# Patient Record
Sex: Female | Born: 1945 | Race: White | Hispanic: No | Marital: Married | Smoking: Never smoker
Health system: Southern US, Community
[De-identification: ages and names within clinical notes are randomized; demographics above are authoritative.]

## PROBLEM LIST (undated history)

## (undated) DIAGNOSIS — E785 Hyperlipidemia, unspecified: Secondary | ICD-10-CM

## (undated) DIAGNOSIS — I2510 Atherosclerotic heart disease of native coronary artery without angina pectoris: Secondary | ICD-10-CM

## (undated) DIAGNOSIS — I6529 Occlusion and stenosis of unspecified carotid artery: Secondary | ICD-10-CM

## (undated) HISTORY — PX: CARDIAC CATHETERIZATION: SHX172

---

## 2015-10-06 DIAGNOSIS — H5203 Hypermetropia, bilateral: Secondary | ICD-10-CM | POA: Diagnosis not present

## 2015-11-18 DIAGNOSIS — H35373 Puckering of macula, bilateral: Secondary | ICD-10-CM | POA: Diagnosis not present

## 2015-11-18 DIAGNOSIS — H43813 Vitreous degeneration, bilateral: Secondary | ICD-10-CM | POA: Diagnosis not present

## 2015-11-23 DIAGNOSIS — H02833 Dermatochalasis of right eye, unspecified eyelid: Secondary | ICD-10-CM | POA: Diagnosis not present

## 2015-11-23 DIAGNOSIS — H25812 Combined forms of age-related cataract, left eye: Secondary | ICD-10-CM | POA: Diagnosis not present

## 2015-11-23 DIAGNOSIS — H25811 Combined forms of age-related cataract, right eye: Secondary | ICD-10-CM | POA: Diagnosis not present

## 2015-11-23 DIAGNOSIS — H35371 Puckering of macula, right eye: Secondary | ICD-10-CM | POA: Diagnosis not present

## 2015-11-24 DIAGNOSIS — H35371 Puckering of macula, right eye: Secondary | ICD-10-CM | POA: Diagnosis not present

## 2015-12-01 DIAGNOSIS — H25811 Combined forms of age-related cataract, right eye: Secondary | ICD-10-CM | POA: Diagnosis not present

## 2015-12-01 DIAGNOSIS — H2511 Age-related nuclear cataract, right eye: Secondary | ICD-10-CM | POA: Diagnosis not present

## 2015-12-15 DIAGNOSIS — H25812 Combined forms of age-related cataract, left eye: Secondary | ICD-10-CM | POA: Diagnosis not present

## 2015-12-15 DIAGNOSIS — H2512 Age-related nuclear cataract, left eye: Secondary | ICD-10-CM | POA: Diagnosis not present

## 2016-02-13 DIAGNOSIS — R7303 Prediabetes: Secondary | ICD-10-CM | POA: Diagnosis not present

## 2016-02-13 DIAGNOSIS — E559 Vitamin D deficiency, unspecified: Secondary | ICD-10-CM | POA: Diagnosis not present

## 2016-02-13 DIAGNOSIS — E785 Hyperlipidemia, unspecified: Secondary | ICD-10-CM | POA: Diagnosis not present

## 2016-02-13 DIAGNOSIS — Z0000 Encounter for general adult medical examination without abnormal findings: Secondary | ICD-10-CM | POA: Diagnosis not present

## 2016-03-28 DIAGNOSIS — H02833 Dermatochalasis of right eye, unspecified eyelid: Secondary | ICD-10-CM | POA: Diagnosis not present

## 2016-03-28 DIAGNOSIS — H02836 Dermatochalasis of left eye, unspecified eyelid: Secondary | ICD-10-CM | POA: Diagnosis not present

## 2016-05-31 DIAGNOSIS — M9903 Segmental and somatic dysfunction of lumbar region: Secondary | ICD-10-CM | POA: Diagnosis not present

## 2016-05-31 DIAGNOSIS — M5414 Radiculopathy, thoracic region: Secondary | ICD-10-CM | POA: Diagnosis not present

## 2016-05-31 DIAGNOSIS — M9902 Segmental and somatic dysfunction of thoracic region: Secondary | ICD-10-CM | POA: Diagnosis not present

## 2016-05-31 DIAGNOSIS — M9905 Segmental and somatic dysfunction of pelvic region: Secondary | ICD-10-CM | POA: Diagnosis not present

## 2016-05-31 DIAGNOSIS — M5416 Radiculopathy, lumbar region: Secondary | ICD-10-CM | POA: Diagnosis not present

## 2016-06-06 DIAGNOSIS — M9905 Segmental and somatic dysfunction of pelvic region: Secondary | ICD-10-CM | POA: Diagnosis not present

## 2016-06-06 DIAGNOSIS — M9902 Segmental and somatic dysfunction of thoracic region: Secondary | ICD-10-CM | POA: Diagnosis not present

## 2016-06-06 DIAGNOSIS — M9903 Segmental and somatic dysfunction of lumbar region: Secondary | ICD-10-CM | POA: Diagnosis not present

## 2016-06-06 DIAGNOSIS — M5414 Radiculopathy, thoracic region: Secondary | ICD-10-CM | POA: Diagnosis not present

## 2016-06-06 DIAGNOSIS — M5416 Radiculopathy, lumbar region: Secondary | ICD-10-CM | POA: Diagnosis not present

## 2016-06-11 DIAGNOSIS — H02836 Dermatochalasis of left eye, unspecified eyelid: Secondary | ICD-10-CM | POA: Diagnosis not present

## 2016-06-11 DIAGNOSIS — H02834 Dermatochalasis of left upper eyelid: Secondary | ICD-10-CM | POA: Diagnosis not present

## 2016-06-11 DIAGNOSIS — H02833 Dermatochalasis of right eye, unspecified eyelid: Secondary | ICD-10-CM | POA: Diagnosis not present

## 2016-06-11 DIAGNOSIS — H02831 Dermatochalasis of right upper eyelid: Secondary | ICD-10-CM | POA: Diagnosis not present

## 2016-08-22 DIAGNOSIS — M5416 Radiculopathy, lumbar region: Secondary | ICD-10-CM | POA: Diagnosis not present

## 2016-08-22 DIAGNOSIS — M9902 Segmental and somatic dysfunction of thoracic region: Secondary | ICD-10-CM | POA: Diagnosis not present

## 2016-08-22 DIAGNOSIS — M9905 Segmental and somatic dysfunction of pelvic region: Secondary | ICD-10-CM | POA: Diagnosis not present

## 2016-08-22 DIAGNOSIS — M9903 Segmental and somatic dysfunction of lumbar region: Secondary | ICD-10-CM | POA: Diagnosis not present

## 2016-08-22 DIAGNOSIS — M5414 Radiculopathy, thoracic region: Secondary | ICD-10-CM | POA: Diagnosis not present

## 2016-08-27 DIAGNOSIS — M9905 Segmental and somatic dysfunction of pelvic region: Secondary | ICD-10-CM | POA: Diagnosis not present

## 2016-08-27 DIAGNOSIS — M5414 Radiculopathy, thoracic region: Secondary | ICD-10-CM | POA: Diagnosis not present

## 2016-08-27 DIAGNOSIS — M5416 Radiculopathy, lumbar region: Secondary | ICD-10-CM | POA: Diagnosis not present

## 2016-08-27 DIAGNOSIS — M9902 Segmental and somatic dysfunction of thoracic region: Secondary | ICD-10-CM | POA: Diagnosis not present

## 2016-08-27 DIAGNOSIS — M9903 Segmental and somatic dysfunction of lumbar region: Secondary | ICD-10-CM | POA: Diagnosis not present

## 2016-09-03 DIAGNOSIS — M5416 Radiculopathy, lumbar region: Secondary | ICD-10-CM | POA: Diagnosis not present

## 2016-09-03 DIAGNOSIS — M9905 Segmental and somatic dysfunction of pelvic region: Secondary | ICD-10-CM | POA: Diagnosis not present

## 2016-09-03 DIAGNOSIS — M9902 Segmental and somatic dysfunction of thoracic region: Secondary | ICD-10-CM | POA: Diagnosis not present

## 2016-09-03 DIAGNOSIS — M5414 Radiculopathy, thoracic region: Secondary | ICD-10-CM | POA: Diagnosis not present

## 2016-09-03 DIAGNOSIS — M9903 Segmental and somatic dysfunction of lumbar region: Secondary | ICD-10-CM | POA: Diagnosis not present

## 2016-10-15 DIAGNOSIS — M9905 Segmental and somatic dysfunction of pelvic region: Secondary | ICD-10-CM | POA: Diagnosis not present

## 2016-10-15 DIAGNOSIS — M5416 Radiculopathy, lumbar region: Secondary | ICD-10-CM | POA: Diagnosis not present

## 2016-10-15 DIAGNOSIS — M9902 Segmental and somatic dysfunction of thoracic region: Secondary | ICD-10-CM | POA: Diagnosis not present

## 2016-10-15 DIAGNOSIS — M9903 Segmental and somatic dysfunction of lumbar region: Secondary | ICD-10-CM | POA: Diagnosis not present

## 2016-10-15 DIAGNOSIS — M5414 Radiculopathy, thoracic region: Secondary | ICD-10-CM | POA: Diagnosis not present

## 2016-12-19 DIAGNOSIS — M5414 Radiculopathy, thoracic region: Secondary | ICD-10-CM | POA: Diagnosis not present

## 2016-12-19 DIAGNOSIS — M5416 Radiculopathy, lumbar region: Secondary | ICD-10-CM | POA: Diagnosis not present

## 2016-12-19 DIAGNOSIS — M9905 Segmental and somatic dysfunction of pelvic region: Secondary | ICD-10-CM | POA: Diagnosis not present

## 2016-12-19 DIAGNOSIS — M9903 Segmental and somatic dysfunction of lumbar region: Secondary | ICD-10-CM | POA: Diagnosis not present

## 2016-12-19 DIAGNOSIS — M9902 Segmental and somatic dysfunction of thoracic region: Secondary | ICD-10-CM | POA: Diagnosis not present

## 2017-01-15 DIAGNOSIS — M9902 Segmental and somatic dysfunction of thoracic region: Secondary | ICD-10-CM | POA: Diagnosis not present

## 2017-01-15 DIAGNOSIS — M5414 Radiculopathy, thoracic region: Secondary | ICD-10-CM | POA: Diagnosis not present

## 2017-01-15 DIAGNOSIS — M5416 Radiculopathy, lumbar region: Secondary | ICD-10-CM | POA: Diagnosis not present

## 2017-01-15 DIAGNOSIS — M9903 Segmental and somatic dysfunction of lumbar region: Secondary | ICD-10-CM | POA: Diagnosis not present

## 2017-01-15 DIAGNOSIS — M9905 Segmental and somatic dysfunction of pelvic region: Secondary | ICD-10-CM | POA: Diagnosis not present

## 2017-02-14 DIAGNOSIS — Z0000 Encounter for general adult medical examination without abnormal findings: Secondary | ICD-10-CM | POA: Diagnosis not present

## 2017-02-14 DIAGNOSIS — Z6838 Body mass index (BMI) 38.0-38.9, adult: Secondary | ICD-10-CM | POA: Diagnosis not present

## 2017-02-14 DIAGNOSIS — E559 Vitamin D deficiency, unspecified: Secondary | ICD-10-CM | POA: Diagnosis not present

## 2017-02-14 DIAGNOSIS — E785 Hyperlipidemia, unspecified: Secondary | ICD-10-CM | POA: Diagnosis not present

## 2017-04-03 DIAGNOSIS — E2839 Other primary ovarian failure: Secondary | ICD-10-CM | POA: Diagnosis not present

## 2017-04-03 DIAGNOSIS — M810 Age-related osteoporosis without current pathological fracture: Secondary | ICD-10-CM | POA: Diagnosis not present

## 2017-05-16 DIAGNOSIS — E559 Vitamin D deficiency, unspecified: Secondary | ICD-10-CM | POA: Diagnosis not present

## 2017-05-16 DIAGNOSIS — E785 Hyperlipidemia, unspecified: Secondary | ICD-10-CM | POA: Diagnosis not present

## 2018-11-19 DIAGNOSIS — E785 Hyperlipidemia, unspecified: Secondary | ICD-10-CM | POA: Diagnosis not present

## 2018-11-19 DIAGNOSIS — Z0000 Encounter for general adult medical examination without abnormal findings: Secondary | ICD-10-CM | POA: Diagnosis not present

## 2018-11-19 DIAGNOSIS — M818 Other osteoporosis without current pathological fracture: Secondary | ICD-10-CM | POA: Diagnosis not present

## 2018-11-19 DIAGNOSIS — E559 Vitamin D deficiency, unspecified: Secondary | ICD-10-CM | POA: Diagnosis not present

## 2019-10-23 ENCOUNTER — Ambulatory Visit: Payer: Self-pay

## 2019-10-28 ENCOUNTER — Ambulatory Visit: Payer: Self-pay

## 2019-10-29 ENCOUNTER — Ambulatory Visit: Payer: PPO | Attending: Internal Medicine

## 2019-12-31 DIAGNOSIS — M818 Other osteoporosis without current pathological fracture: Secondary | ICD-10-CM | POA: Diagnosis not present

## 2019-12-31 DIAGNOSIS — R7301 Impaired fasting glucose: Secondary | ICD-10-CM | POA: Diagnosis not present

## 2019-12-31 DIAGNOSIS — E785 Hyperlipidemia, unspecified: Secondary | ICD-10-CM | POA: Diagnosis not present

## 2019-12-31 DIAGNOSIS — E559 Vitamin D deficiency, unspecified: Secondary | ICD-10-CM | POA: Diagnosis not present

## 2019-12-31 DIAGNOSIS — Z0000 Encounter for general adult medical examination without abnormal findings: Secondary | ICD-10-CM | POA: Diagnosis not present

## 2019-12-31 DIAGNOSIS — M25561 Pain in right knee: Secondary | ICD-10-CM | POA: Diagnosis not present

## 2021-01-18 DIAGNOSIS — R7303 Prediabetes: Secondary | ICD-10-CM | POA: Diagnosis not present

## 2021-01-18 DIAGNOSIS — Z79899 Other long term (current) drug therapy: Secondary | ICD-10-CM | POA: Diagnosis not present

## 2021-01-18 DIAGNOSIS — E559 Vitamin D deficiency, unspecified: Secondary | ICD-10-CM | POA: Diagnosis not present

## 2021-01-30 DIAGNOSIS — M9903 Segmental and somatic dysfunction of lumbar region: Secondary | ICD-10-CM | POA: Diagnosis not present

## 2021-01-30 DIAGNOSIS — M5136 Other intervertebral disc degeneration, lumbar region: Secondary | ICD-10-CM | POA: Diagnosis not present

## 2021-01-30 DIAGNOSIS — M5431 Sciatica, right side: Secondary | ICD-10-CM | POA: Diagnosis not present

## 2021-01-30 DIAGNOSIS — M9905 Segmental and somatic dysfunction of pelvic region: Secondary | ICD-10-CM | POA: Diagnosis not present

## 2021-01-30 DIAGNOSIS — M9902 Segmental and somatic dysfunction of thoracic region: Secondary | ICD-10-CM | POA: Diagnosis not present

## 2021-01-30 DIAGNOSIS — M5134 Other intervertebral disc degeneration, thoracic region: Secondary | ICD-10-CM | POA: Diagnosis not present

## 2021-02-02 DIAGNOSIS — E559 Vitamin D deficiency, unspecified: Secondary | ICD-10-CM | POA: Diagnosis not present

## 2021-02-02 DIAGNOSIS — M818 Other osteoporosis without current pathological fracture: Secondary | ICD-10-CM | POA: Diagnosis not present

## 2021-02-02 DIAGNOSIS — Z1211 Encounter for screening for malignant neoplasm of colon: Secondary | ICD-10-CM | POA: Diagnosis not present

## 2021-02-02 DIAGNOSIS — Z79899 Other long term (current) drug therapy: Secondary | ICD-10-CM | POA: Diagnosis not present

## 2021-02-02 DIAGNOSIS — Z0000 Encounter for general adult medical examination without abnormal findings: Secondary | ICD-10-CM | POA: Diagnosis not present

## 2021-02-02 DIAGNOSIS — R7303 Prediabetes: Secondary | ICD-10-CM | POA: Diagnosis not present

## 2021-02-02 DIAGNOSIS — E785 Hyperlipidemia, unspecified: Secondary | ICD-10-CM | POA: Diagnosis not present

## 2021-02-06 DIAGNOSIS — M9905 Segmental and somatic dysfunction of pelvic region: Secondary | ICD-10-CM | POA: Diagnosis not present

## 2021-02-06 DIAGNOSIS — M5134 Other intervertebral disc degeneration, thoracic region: Secondary | ICD-10-CM | POA: Diagnosis not present

## 2021-02-06 DIAGNOSIS — M9902 Segmental and somatic dysfunction of thoracic region: Secondary | ICD-10-CM | POA: Diagnosis not present

## 2021-02-06 DIAGNOSIS — M5431 Sciatica, right side: Secondary | ICD-10-CM | POA: Diagnosis not present

## 2021-02-06 DIAGNOSIS — M9903 Segmental and somatic dysfunction of lumbar region: Secondary | ICD-10-CM | POA: Diagnosis not present

## 2021-02-06 DIAGNOSIS — M5136 Other intervertebral disc degeneration, lumbar region: Secondary | ICD-10-CM | POA: Diagnosis not present

## 2021-03-08 DIAGNOSIS — M5134 Other intervertebral disc degeneration, thoracic region: Secondary | ICD-10-CM | POA: Diagnosis not present

## 2021-03-08 DIAGNOSIS — M9903 Segmental and somatic dysfunction of lumbar region: Secondary | ICD-10-CM | POA: Diagnosis not present

## 2021-03-08 DIAGNOSIS — M5136 Other intervertebral disc degeneration, lumbar region: Secondary | ICD-10-CM | POA: Diagnosis not present

## 2021-03-08 DIAGNOSIS — M5431 Sciatica, right side: Secondary | ICD-10-CM | POA: Diagnosis not present

## 2021-03-08 DIAGNOSIS — M9905 Segmental and somatic dysfunction of pelvic region: Secondary | ICD-10-CM | POA: Diagnosis not present

## 2021-03-08 DIAGNOSIS — M9902 Segmental and somatic dysfunction of thoracic region: Secondary | ICD-10-CM | POA: Diagnosis not present

## 2021-04-17 DIAGNOSIS — M9905 Segmental and somatic dysfunction of pelvic region: Secondary | ICD-10-CM | POA: Diagnosis not present

## 2021-04-17 DIAGNOSIS — M5136 Other intervertebral disc degeneration, lumbar region: Secondary | ICD-10-CM | POA: Diagnosis not present

## 2021-04-17 DIAGNOSIS — M5431 Sciatica, right side: Secondary | ICD-10-CM | POA: Diagnosis not present

## 2021-04-17 DIAGNOSIS — M5134 Other intervertebral disc degeneration, thoracic region: Secondary | ICD-10-CM | POA: Diagnosis not present

## 2021-04-17 DIAGNOSIS — M9902 Segmental and somatic dysfunction of thoracic region: Secondary | ICD-10-CM | POA: Diagnosis not present

## 2021-04-17 DIAGNOSIS — M9903 Segmental and somatic dysfunction of lumbar region: Secondary | ICD-10-CM | POA: Diagnosis not present

## 2021-06-19 ENCOUNTER — Emergency Department (HOSPITAL_BASED_OUTPATIENT_CLINIC_OR_DEPARTMENT_OTHER)
Admission: EM | Admit: 2021-06-19 | Discharge: 2021-06-19 | Disposition: A | Discharge status: Home / Self Care | Payer: PPO | Attending: Emergency Medicine | Admitting: Emergency Medicine

## 2021-06-19 ENCOUNTER — Other Ambulatory Visit: Payer: Self-pay

## 2021-06-19 ENCOUNTER — Emergency Department (HOSPITAL_BASED_OUTPATIENT_CLINIC_OR_DEPARTMENT_OTHER): Payer: PPO | Admitting: Radiology

## 2021-06-19 ENCOUNTER — Encounter (HOSPITAL_BASED_OUTPATIENT_CLINIC_OR_DEPARTMENT_OTHER): Payer: Self-pay | Admitting: Emergency Medicine

## 2021-06-19 DIAGNOSIS — W01198A Fall on same level from slipping, tripping and stumbling with subsequent striking against other object, initial encounter: Secondary | ICD-10-CM | POA: Diagnosis not present

## 2021-06-19 DIAGNOSIS — Y9301 Activity, walking, marching and hiking: Secondary | ICD-10-CM | POA: Diagnosis not present

## 2021-06-19 DIAGNOSIS — M545 Low back pain, unspecified: Secondary | ICD-10-CM | POA: Diagnosis not present

## 2021-06-19 DIAGNOSIS — M5459 Other low back pain: Secondary | ICD-10-CM | POA: Diagnosis not present

## 2021-06-19 MED ORDER — ACETAMINOPHEN 500 MG PO TABS
1000.0000 mg | ORAL_TABLET | Freq: Once | ORAL | Status: AC
  Administered 2021-06-19: 1000 mg via ORAL
  Filled 2021-06-19: qty 2

## 2021-06-19 NOTE — ED Triage Notes (Signed)
Pt arrives to ED with c/o back/hip pain after falling into a pool on Wednesday 9/21. Pt reports she slipped while walking into a pool and slid down on the cement steps.

## 2021-06-19 NOTE — Discharge Instructions (Addendum)
Your x-ray did not show any signs of a fracture.  Continue taking Tylenol 650 mg 3 times a day as needed for pain.  Follow-up with your doctor within the week.  Return to the ER immediately if you have new numbness weakness worsening pain or any additional concerns.

## 2021-06-19 NOTE — ED Provider Notes (Signed)
[facility] EMERGENCY DEPT Provider Note   CSN: [id number] Arrival date & time: 06/19/21  0946     History Chief Complaint  Patient presents with   Back Pain    Cathy Love is a 75 y.o. female.  Patient presents to ER chief complaint of right lower back pain.  She states symptoms been ongoing for the past 5 days.  She states that 5 days ago she slipped while in walking on a pool and fell down cement steps on her back.  Complaining of sharp pain in the right lower back since then.  Symptoms have not improved so she presents to ER.  Denies any new numbness or weakness.  Has been taking Advil around-the-clock for her pain at home.  No fever no cough no vomiting no diarrhea.  No bowel or bladder dysfunction.  No new numbness.      History reviewed. No pertinent past medical history.  There are no problems to display for this patient.   History reviewed. No pertinent surgical history.   OB History   No obstetric history on file.     History reviewed. No pertinent family history.  Social History   Tobacco Use   Smoking status: Never   Smokeless tobacco: Never    Home Medications Prior to Admission medications   Not on File    Allergies    Patient has no known allergies.  Review of Systems   Review of Systems  Constitutional:  Negative for fever.  HENT:  Negative for ear pain.   Eyes:  Negative for pain.  Respiratory:  Negative for cough.   Cardiovascular:  Negative for chest pain.  Gastrointestinal:  Negative for abdominal pain.  Genitourinary:  Negative for flank pain.  Musculoskeletal:  Positive for back pain.  Skin:  Negative for rash.  Neurological:  Negative for headaches.   Physical Exam Updated Vital Signs BP (!) 140/95 (BP Location: Left Arm)   Pulse 73   Temp 98.1 F (36.7 C)   Resp 18   Ht 4\' 9"  (1.448 m)   Wt 88.5 kg   SpO2 97%   BMI 42.20 kg/m   Physical Exam Constitutional:      General: She is not in acute  distress.    Appearance: Normal appearance.  HENT:     Head: Normocephalic.     Nose: Nose normal.  Eyes:     Extraocular Movements: Extraocular movements intact.  Cardiovascular:     Rate and Rhythm: Normal rate.  Pulmonary:     Effort: Pulmonary effort is normal.  Musculoskeletal:        General: Normal range of motion.     Cervical back: Normal range of motion.     Comments: No C or T or L-spine midline step-offs or tenderness noted.  Mild L4-5 right paraspinal tenderness present.  Neurological:     General: No focal deficit present.     Mental Status: She is alert. Mental status is at baseline.     Comments: No saddle anesthesia noted.    ED Results / Procedures / Treatments   Labs (all labs ordered are listed, but only abnormal results are displayed) Labs Reviewed - No data to display  EKG None  Radiology DG Lumbar Spine Complete  Result Date: 06/19/2021 CLINICAL DATA:  Back pain with history of injury EXAM: LUMBAR SPINE - COMPLETE 4+ VIEW COMPARISON:  None. FINDINGS: No evidence of lumbar spine fracture. Levoscoliosis with rotatory component. Multilevel moderate degenerative disc disease. Mild facet  arthropathy. Aortic atherosclerotic calcifications. Soft tissues are unremarkable. IMPRESSION: No evidence of lumbar spine fracture, although presence of significant levocurvature limits evaluation of vertebral body heights on lateral view. Moderate multilevel degenerative disc disease. Electronically Signed   By: Allegra Lai M.D.   On: 06/19/2021 11:50    Procedures Procedures   Medications Ordered in ED Medications  acetaminophen (TYLENOL) tablet 1,000 mg (1,000 mg Oral Given 06/19/21 1110)    ED Course  I have reviewed the triage vital signs and the nursing notes.  Pertinent labs & imaging results that were available during my care of the patient were reviewed by me and considered in my medical decision making (see chart for details).    MDM  Rules/Calculators/A&P                           Patient given Tylenol here, declining narcotic medication.  X-ray shows no acute fracture.  Patient advised continue Tylenol and Motrin use at home, advising immediate return for new numbness weakness worsening pain or additional concerns.  Advised her to follow-up with her doctor within the week.  Final Clinical Impression(s) / ED Diagnoses Final diagnoses:  Acute right-sided low back pain without sciatica    Rx / DC Orders ED Discharge Orders     None        Cheryll Cockayne, MD 06/19/21 1156

## 2021-07-25 ENCOUNTER — Ambulatory Visit: Payer: PPO

## 2021-08-07 ENCOUNTER — Other Ambulatory Visit (HOSPITAL_BASED_OUTPATIENT_CLINIC_OR_DEPARTMENT_OTHER): Payer: Self-pay

## 2021-08-07 ENCOUNTER — Ambulatory Visit: Payer: PPO | Attending: Internal Medicine

## 2021-08-07 DIAGNOSIS — Z23 Encounter for immunization: Secondary | ICD-10-CM

## 2021-08-07 MED ORDER — MODERNA COVID-19 BIVAL BOOSTER 50 MCG/0.5ML IM SUSP
INTRAMUSCULAR | 0 refills | Status: DC
  Filled 2021-08-07: qty 0.5, 1d supply, fill #0

## 2021-08-07 NOTE — Progress Notes (Signed)
   Covid-19 Vaccination Clinic  Name:  Cathy Love    MRN: [id number] DOB: [birth year]  08/07/2021  [name] was observed post Covid-19 immunization for 15 minutes without incident. She was provided with Vaccine Information Sheet and instruction to access the V-Safe system.   [name] was instructed to call 911 with any severe reactions post vaccine: Difficulty breathing  Swelling of face and throat  A fast heartbeat  A bad rash all over body  Dizziness and weakness   Immunizations Administered     Name Date Dose VIS Date Route   Moderna Covid-19 vaccine Bivalent Booster 08/07/2021 11:42 AM 0.5 mL 05/06/2021 Intramuscular   Manufacturer: Moderna   Lot: [id number]   NDC: 62694-854-62

## 2021-08-28 ENCOUNTER — Other Ambulatory Visit (HOSPITAL_BASED_OUTPATIENT_CLINIC_OR_DEPARTMENT_OTHER): Payer: Self-pay

## 2021-08-28 MED ORDER — FLUAD QUADRIVALENT 0.5 ML IM PRSY
PREFILLED_SYRINGE | INTRAMUSCULAR | 0 refills | Status: DC
  Filled 2021-08-28: qty 0.5, 1d supply, fill #0

## 2021-09-04 ENCOUNTER — Other Ambulatory Visit (HOSPITAL_BASED_OUTPATIENT_CLINIC_OR_DEPARTMENT_OTHER): Payer: Self-pay

## 2022-02-05 DIAGNOSIS — E785 Hyperlipidemia, unspecified: Secondary | ICD-10-CM | POA: Diagnosis not present

## 2022-02-05 DIAGNOSIS — R7303 Prediabetes: Secondary | ICD-10-CM | POA: Diagnosis not present

## 2022-02-05 DIAGNOSIS — M818 Other osteoporosis without current pathological fracture: Secondary | ICD-10-CM | POA: Diagnosis not present

## 2022-02-05 DIAGNOSIS — E559 Vitamin D deficiency, unspecified: Secondary | ICD-10-CM | POA: Diagnosis not present

## 2022-02-12 DIAGNOSIS — R7303 Prediabetes: Secondary | ICD-10-CM | POA: Diagnosis not present

## 2022-02-12 DIAGNOSIS — E785 Hyperlipidemia, unspecified: Secondary | ICD-10-CM | POA: Diagnosis not present

## 2022-02-12 DIAGNOSIS — G3184 Mild cognitive impairment, so stated: Secondary | ICD-10-CM | POA: Diagnosis not present

## 2022-02-12 DIAGNOSIS — Z0000 Encounter for general adult medical examination without abnormal findings: Secondary | ICD-10-CM | POA: Diagnosis not present

## 2022-02-12 DIAGNOSIS — E559 Vitamin D deficiency, unspecified: Secondary | ICD-10-CM | POA: Diagnosis not present

## 2022-02-12 DIAGNOSIS — M818 Other osteoporosis without current pathological fracture: Secondary | ICD-10-CM | POA: Diagnosis not present

## 2022-02-18 ENCOUNTER — Inpatient Hospital Stay (HOSPITAL_COMMUNITY)
Admission: EM | Admit: 2022-02-18 | Discharge: 2022-02-26 | DRG: 234 | Disposition: A | Discharge status: Home Health | Payer: PPO | Attending: Cardiothoracic Surgery | Admitting: Cardiothoracic Surgery

## 2022-02-18 ENCOUNTER — Inpatient Hospital Stay (HOSPITAL_COMMUNITY): Payer: PPO

## 2022-02-18 ENCOUNTER — Encounter (HOSPITAL_COMMUNITY): Payer: Self-pay

## 2022-02-18 ENCOUNTER — Emergency Department (HOSPITAL_COMMUNITY): Payer: PPO

## 2022-02-18 ENCOUNTER — Other Ambulatory Visit: Payer: Self-pay

## 2022-02-18 ENCOUNTER — Other Ambulatory Visit (HOSPITAL_COMMUNITY): Payer: PPO

## 2022-02-18 DIAGNOSIS — Z951 Presence of aortocoronary bypass graft: Secondary | ICD-10-CM

## 2022-02-18 DIAGNOSIS — Z7982 Long term (current) use of aspirin: Secondary | ICD-10-CM | POA: Diagnosis not present

## 2022-02-18 DIAGNOSIS — R202 Paresthesia of skin: Secondary | ICD-10-CM | POA: Diagnosis not present

## 2022-02-18 DIAGNOSIS — I214 Non-ST elevation (NSTEMI) myocardial infarction: Principal | ICD-10-CM | POA: Diagnosis present

## 2022-02-18 DIAGNOSIS — E785 Hyperlipidemia, unspecified: Secondary | ICD-10-CM | POA: Diagnosis not present

## 2022-02-18 DIAGNOSIS — E669 Obesity, unspecified: Secondary | ICD-10-CM | POA: Diagnosis not present

## 2022-02-18 DIAGNOSIS — R079 Chest pain, unspecified: Secondary | ICD-10-CM | POA: Diagnosis not present

## 2022-02-18 DIAGNOSIS — Z9049 Acquired absence of other specified parts of digestive tract: Secondary | ICD-10-CM

## 2022-02-18 DIAGNOSIS — R778 Other specified abnormalities of plasma proteins: Secondary | ICD-10-CM | POA: Diagnosis not present

## 2022-02-18 DIAGNOSIS — Z4682 Encounter for fitting and adjustment of non-vascular catheter: Secondary | ICD-10-CM | POA: Diagnosis not present

## 2022-02-18 DIAGNOSIS — I200 Unstable angina: Secondary | ICD-10-CM | POA: Diagnosis present

## 2022-02-18 DIAGNOSIS — K219 Gastro-esophageal reflux disease without esophagitis: Secondary | ICD-10-CM | POA: Diagnosis present

## 2022-02-18 DIAGNOSIS — Z6841 Body Mass Index (BMI) 40.0 and over, adult: Secondary | ICD-10-CM

## 2022-02-18 DIAGNOSIS — D696 Thrombocytopenia, unspecified: Secondary | ICD-10-CM | POA: Diagnosis not present

## 2022-02-18 DIAGNOSIS — D62 Acute posthemorrhagic anemia: Secondary | ICD-10-CM | POA: Diagnosis not present

## 2022-02-18 DIAGNOSIS — D689 Coagulation defect, unspecified: Secondary | ICD-10-CM | POA: Diagnosis present

## 2022-02-18 DIAGNOSIS — Z87891 Personal history of nicotine dependence: Secondary | ICD-10-CM

## 2022-02-18 DIAGNOSIS — E162 Hypoglycemia, unspecified: Secondary | ICD-10-CM | POA: Diagnosis not present

## 2022-02-18 DIAGNOSIS — R531 Weakness: Secondary | ICD-10-CM | POA: Diagnosis not present

## 2022-02-18 DIAGNOSIS — I25119 Atherosclerotic heart disease of native coronary artery with unspecified angina pectoris: Secondary | ICD-10-CM | POA: Diagnosis not present

## 2022-02-18 DIAGNOSIS — E8770 Fluid overload, unspecified: Secondary | ICD-10-CM | POA: Diagnosis not present

## 2022-02-18 DIAGNOSIS — I2510 Atherosclerotic heart disease of native coronary artery without angina pectoris: Secondary | ICD-10-CM | POA: Diagnosis not present

## 2022-02-18 DIAGNOSIS — G4489 Other headache syndrome: Secondary | ICD-10-CM | POA: Diagnosis not present

## 2022-02-18 DIAGNOSIS — I491 Atrial premature depolarization: Secondary | ICD-10-CM | POA: Diagnosis not present

## 2022-02-18 DIAGNOSIS — R0789 Other chest pain: Principal | ICD-10-CM

## 2022-02-18 DIAGNOSIS — I088 Other rheumatic multiple valve diseases: Secondary | ICD-10-CM | POA: Diagnosis not present

## 2022-02-18 DIAGNOSIS — I8393 Asymptomatic varicose veins of bilateral lower extremities: Secondary | ICD-10-CM | POA: Diagnosis not present

## 2022-02-18 DIAGNOSIS — I25110 Atherosclerotic heart disease of native coronary artery with unstable angina pectoris: Secondary | ICD-10-CM | POA: Diagnosis not present

## 2022-02-18 DIAGNOSIS — R7989 Other specified abnormal findings of blood chemistry: Secondary | ICD-10-CM

## 2022-02-18 DIAGNOSIS — I3139 Other pericardial effusion (noninflammatory): Secondary | ICD-10-CM | POA: Diagnosis not present

## 2022-02-18 DIAGNOSIS — R918 Other nonspecific abnormal finding of lung field: Secondary | ICD-10-CM | POA: Diagnosis not present

## 2022-02-18 DIAGNOSIS — J9811 Atelectasis: Secondary | ICD-10-CM | POA: Diagnosis not present

## 2022-02-18 DIAGNOSIS — J90 Pleural effusion, not elsewhere classified: Secondary | ICD-10-CM | POA: Diagnosis not present

## 2022-02-18 DIAGNOSIS — I252 Old myocardial infarction: Secondary | ICD-10-CM | POA: Diagnosis not present

## 2022-02-18 LAB — ECHOCARDIOGRAM COMPLETE
AR max vel: 1.81 cm2
AV Area VTI: 1.73 cm2
AV Area mean vel: 1.71 cm2
AV Mean grad: 4 mmHg
AV Peak grad: 7.8 mmHg
Ao pk vel: 1.4 m/s
Area-P 1/2: 2.99 cm2
Calc EF: 45.6 %
Height: 56.5 in
S' Lateral: 1.9 cm
Single Plane A2C EF: 45 %
Single Plane A4C EF: 45.6 %
Weight: 2938.29 oz

## 2022-02-18 LAB — HEPATIC FUNCTION PANEL
ALT: 13 U/L (ref 0–44)
AST: 19 U/L (ref 15–41)
Albumin: 3.2 g/dL — ABNORMAL LOW (ref 3.5–5.0)
Alkaline Phosphatase: 34 U/L — ABNORMAL LOW (ref 38–126)
Bilirubin, Direct: <0.1 mg/dL (ref 0.0–0.2)
Total Bilirubin: 0.5 mg/dL (ref 0.3–1.2)
Total Protein: 6 g/dL — ABNORMAL LOW (ref 6.5–8.1)

## 2022-02-18 LAB — MAGNESIUM: Magnesium: 2.1 mg/dL (ref 1.7–2.4)

## 2022-02-18 LAB — CBC WITH DIFFERENTIAL/PLATELET
Abs Immature Granulocytes: 0.02 10*3/uL (ref 0.00–0.07)
Basophils Absolute: 0.1 10*3/uL (ref 0.0–0.1)
Basophils Relative: 1 %
Eosinophils Absolute: 0.1 10*3/uL (ref 0.0–0.5)
Eosinophils Relative: 3 %
HCT: 44.3 % (ref 36.0–46.0)
Hemoglobin: 14.3 g/dL (ref 12.0–15.0)
Immature Granulocytes: 0 %
Lymphocytes Relative: 26 %
Lymphs Abs: 1.4 10*3/uL (ref 0.7–4.0)
MCH: 29.9 pg (ref 26.0–34.0)
MCHC: 32.3 g/dL (ref 30.0–36.0)
MCV: 92.5 fL (ref 80.0–100.0)
Monocytes Absolute: 0.5 10*3/uL (ref 0.1–1.0)
Monocytes Relative: 9 %
Neutro Abs: 3.2 10*3/uL (ref 1.7–7.7)
Neutrophils Relative %: 61 %
Platelets: 211 10*3/uL (ref 150–400)
RBC: 4.79 MIL/uL (ref 3.87–5.11)
RDW: 13.4 % (ref 11.5–15.5)
WBC: 5.2 10*3/uL (ref 4.0–10.5)
nRBC: 0 % (ref 0.0–0.2)

## 2022-02-18 LAB — TROPONIN I (HIGH SENSITIVITY)
Troponin I (High Sensitivity): 36 ng/L — ABNORMAL HIGH (ref <18)
Troponin I (High Sensitivity): 182 ng/L (ref <18)
Troponin I (High Sensitivity): 584 ng/L (ref <18)

## 2022-02-18 LAB — BASIC METABOLIC PANEL
Anion gap: 9 (ref 5–15)
BUN: 18 mg/dL (ref 8–23)
CO2: 23 mmol/L (ref 22–32)
Calcium: 8.8 mg/dL — ABNORMAL LOW (ref 8.9–10.3)
Chloride: 108 mmol/L (ref 98–111)
Creatinine, Ser: 0.74 mg/dL (ref 0.44–1.00)
GFR, Estimated: >60 mL/min (ref >60)
Glucose, Bld: 113 mg/dL — ABNORMAL HIGH (ref 70–99)
Potassium: 4.3 mmol/L (ref 3.5–5.1)
Sodium: 140 mmol/L (ref 135–145)

## 2022-02-18 LAB — HEPARIN LEVEL (UNFRACTIONATED): Heparin Unfractionated: 0.36 IU/mL (ref 0.30–0.70)

## 2022-02-18 LAB — HEMOGLOBIN A1C
Hgb A1c MFr Bld: 5.6 % (ref 4.8–5.6)
Mean Plasma Glucose: 114.02 mg/dL

## 2022-02-18 LAB — TSH: TSH: 0.888 u[IU]/mL (ref 0.350–4.500)

## 2022-02-18 MED ORDER — ONDANSETRON HCL 4 MG/2ML IJ SOLN
4.0000 mg | Freq: Four times a day (QID) | INTRAMUSCULAR | Status: DC | PRN
Start: 2022-02-18 — End: 2022-02-20

## 2022-02-18 MED ORDER — SODIUM CHLORIDE 0.9% FLUSH
3.0000 mL | Freq: Two times a day (BID) | INTRAVENOUS | Status: DC
  Administered 2022-02-19 – 2022-02-20 (×2): 3 mL via INTRAVENOUS

## 2022-02-18 MED ORDER — ASPIRIN 81 MG PO TBEC: 324.0000 mg | DELAYED_RELEASE_TABLET | Freq: Once | ORAL | Status: DC

## 2022-02-18 MED ORDER — ASPIRIN 81 MG PO CHEW: 324.0000 mg | CHEWABLE_TABLET | ORAL | Status: AC

## 2022-02-18 MED ORDER — SODIUM CHLORIDE 0.9 % IV SOLN: INTRAVENOUS | Status: DC

## 2022-02-18 MED ORDER — HEPARIN (PORCINE) 25000 UT/250ML-% IV SOLN
1000.0000 [IU]/h | INTRAVENOUS | Status: DC
  Administered 2022-02-18 – 2022-02-19 (×2): 1000 [IU]/h via INTRAVENOUS
  Filled 2022-02-18 (×2): qty 250

## 2022-02-18 MED ORDER — ACETAMINOPHEN 325 MG PO TABS: 650.0000 mg | ORAL_TABLET | ORAL | Status: DC | PRN

## 2022-02-18 MED ORDER — METOPROLOL TARTRATE 12.5 MG HALF TABLET
12.5000 mg | ORAL_TABLET | Freq: Two times a day (BID) | ORAL | Status: DC
Start: 2022-02-18 — End: 2022-02-21
  Administered 2022-02-18 – 2022-02-20 (×6): 12.5 mg via ORAL
  Filled 2022-02-18 (×6): qty 1

## 2022-02-18 MED ORDER — NITROGLYCERIN 0.4 MG SL SUBL: 0.4000 mg | SUBLINGUAL_TABLET | SUBLINGUAL | Status: DC | PRN

## 2022-02-18 MED ORDER — ATORVASTATIN CALCIUM 80 MG PO TABS
80.0000 mg | ORAL_TABLET | Freq: Every day | ORAL | Status: DC
  Administered 2022-02-18 – 2022-02-19 (×2): 80 mg via ORAL
  Filled 2022-02-18 (×2): qty 1

## 2022-02-18 MED ORDER — ASPIRIN 300 MG RE SUPP
300.0000 mg | RECTAL | Status: AC
  Filled 2022-02-18: qty 1

## 2022-02-18 MED ORDER — HEPARIN BOLUS VIA INFUSION
4000.0000 [IU] | Freq: Once | INTRAVENOUS | Status: AC
  Administered 2022-02-18: 4000 [IU] via INTRAVENOUS
  Filled 2022-02-18: qty 4000

## 2022-02-18 MED ORDER — ASPIRIN 81 MG PO TBEC
81.0000 mg | DELAYED_RELEASE_TABLET | Freq: Every day | ORAL | Status: DC
  Administered 2022-02-19: 81 mg via ORAL
  Filled 2022-02-18: qty 1

## 2022-02-18 NOTE — ED Triage Notes (Signed)
Pt took 324 of ASA PTA.

## 2022-02-18 NOTE — ED Provider Notes (Signed)
[facility] EMERGENCY DEPARTMENT Provider Note   CSN: [id number] Arrival date & time: 02/18/22  0436     History  Chief Complaint  Patient presents with   Chest Pain    Cathy Love is a 76 y.o. female.  The history is provided by the patient.  Chest Pain She has no significant past history and comes in because of an episode of chest tightness.  She states that at about 8 PM, she started having a mild headache.  About midnight, she started having pain in her jaw.  Jaw pain was in both sides.  At 2:30 AM, she started having a tight feeling in her chest.  This did not radiate.  There is no associated dyspnea, nausea, diaphoresis.  She did call EMS and was told to take aspirin and she took aspirin 324 mg.  She states that around the time the ambulance arrived, all symptoms have resolved and she feels like she is back to her baseline.  She estimates that symptoms resolved at about 4:30 AM.  She has never had symptoms like this before.  She denies history of diabetes, hypertension, hyperlipidemia and she is a non-smoker.   Home Medications Prior to Admission medications   Medication Sig Start Date End Date Taking? Authorizing Provider  COVID-19 mRNA bivalent vaccine, Moderna, (MODERNA COVID-19 BIVAL BOOSTER) 50 MCG/0.5ML injection Inject into the muscle. 08/07/21   Judyann Munson, MD  influenza vaccine adjuvanted (FLUAD QUADRIVALENT) 0.5 ML injection Inject into the muscle. 08/28/21         Allergies    Patient has no known allergies.    Review of Systems   Review of Systems  Cardiovascular:  Positive for chest pain.  All other systems reviewed and are negative.  Physical Exam Updated Vital Signs BP (!) 147/87 (BP Location: Right Arm)   Pulse 65   Temp 97.8 F (36.6 C) (Oral)   Resp 15   SpO2 97%  Physical Exam Vitals and nursing note reviewed.  76 year old female, resting comfortably and in no acute distress. Vital signs are normal. Oxygen saturation is  95%, which is normal. Head is normocephalic and atraumatic. PERRLA, EOMI. Oropharynx is clear. Neck is nontender and supple without adenopathy or JVD. Back is nontender and there is no CVA tenderness. Lungs are clear without rales, wheezes, or rhonchi. Chest is nontender. Heart has regular rate and rhythm without murmur. Abdomen is soft, flat, nontender. Extremities have no cyanosis or edema, full range of motion is present. Skin is warm and dry without rash. Neurologic: Mental status is normal, cranial nerves are intact, moves all extremities equally.  ED Results / Procedures / Treatments   Labs (all labs ordered are listed, but only abnormal results are displayed) Labs Reviewed - No data to display  EKG EKG Interpretation  Date/Time:  Sunday Feb 18 2022 04:42:45 EDT Ventricular Rate:  61 PR Interval:  151 QRS Duration: 90 QT Interval:  432 QTC Calculation: 436 R Axis:   -5 Text Interpretation: Sinus rhythm Normal ECG No old tracing to compare Confirmed by Dione Booze ([id number]) on 02/18/2022 5:05:21 AM  Radiology No results found.  Procedures Procedures  Cardiac monitor shows normal sinus rhythm, per my interpretation.  Medications Ordered in ED Medications - No data to display  ED Course/ Medical Decision Making/ A&P                           Medical Decision Making Amount  and/or Complexity of Data Reviewed Labs: ordered. Radiology: ordered.   Episode of chest discomfort, jaw pain, headache.  Certainly need to consider angina equivalent.  Symptoms are atypical for pulmonary embolism, pneumonia.  Doubt pneumothorax, pericarditis, thoracic aneurysm.  Old records are reviewed, and she has no relevant past visits.  ECG is obtained and I have independently viewed and interpreted it and it is completely normal with no prior ECGs available for comparison.  Screening labs are ordered including troponin x2, also chest x-ray is ordered to rule out pneumonia.  I have reviewed  and interpreted patient's laboratory tests.  Initial troponin is mildly elevated at 36.  Patient continues to be pain-free.  With minimal troponin elevation and patient symptom-free, I do not feel she needs to be heparinized at this point.  Even with elevated troponin, patient's heart score is 3, which still puts her at low risk for major adverse cardiac events in the next 6 weeks.  She is being kept in the emergency department for repeat troponin.  Case is signed out to Dr. Sabra Heck.  Final Clinical Impression(s) / ED Diagnoses Final diagnoses:  Chest discomfort  Elevated troponin    Rx / DC Orders ED Discharge Orders     None         Delora Fuel, MD [id number] [phone]

## 2022-02-18 NOTE — Progress Notes (Signed)
ANTICOAGULATION CONSULT NOTE - Initial Consult  Pharmacy Consult for heparin Indication: chest pain/ACS  No Known Allergies  Patient Measurements:   Heparin Dosing Weight: 81kg  Vital Signs: Temp: 97.8 F (36.6 C) (05/28 0442) Temp Source: Oral (05/28 0442) BP: 121/74 (05/28 0900) Pulse Rate: 67 (05/28 0900)  Labs: Recent Labs    02/18/22 0450 02/18/22 0713  HGB 14.3  --   HCT 44.3  --   PLT 211  --   CREATININE 0.74  --   TROPONINIHS 36* 182*    CrCl cannot be calculated (Unknown ideal weight.).   Medical History: History reviewed. No pertinent past medical history.   Assessment: 67 YOF presenting with CP, elevated troponin, she is not on anticoagulation PTA, CBC wnl  Goal of Therapy:  Heparin level 0.3-0.7 units/ml Monitor platelets by anticoagulation protocol: Yes   Plan:  Heparin 4000 units IV x 1, and gtt at 1000 units/hr F/u 8 hour heparin level F/u cards eval and recs  Daylene Posey, PharmD Clinical Pharmacist ED Pharmacist Phone # [phone] 02/18/2022 9:52 AM

## 2022-02-18 NOTE — ED Provider Notes (Signed)
Pt care signed out at change of shift -   The patient is not having any chest pain at this time but did have significant chest discomfort last night associated with some radiation to the bilateral jaws.  She has no prior history of cardiac disease  She does not have a prior cardiac history.  Initial troponin in the 30s repeated in 180s, EKG will be repeated cardiology will be consulted, heparin will be started  This patient is critically ill with a non-ST elevation myocardial infarction.   EKG Interpretation  Date/Time:  Sunday Feb 18 2022 04:42:45 EDT Ventricular Rate:  61 PR Interval:  151 QRS Duration: 90 QT Interval:  432 QTC Calculation: 436 R Axis:   -5 Text Interpretation: Sinus rhythm Normal ECG No old tracing to compare Confirmed by Dione Booze ([id number]) on 02/18/2022 5:05:21 AM        EKG Interpretation  Date/Time:  Sunday Feb 18 2022 09:35:09 EDT Ventricular Rate:  69 PR Interval:  146 QRS Duration: 85 QT Interval:  413 QTC Calculation: 443 R Axis:   3 Text Interpretation: Sinus rhythm Normal ECG Confirmed by Eber Hong [phone]) on 02/18/2022 9:37:26 AM         .Critical Care Performed by: Eber Hong, MD Authorized by: Eber Hong, MD   Critical care provider statement:    Critical care time (minutes):  30   Critical care time was exclusive of:  Separately billable procedures and treating other patients and teaching time   Critical care was necessary to treat or prevent imminent or life-threatening deterioration of the following conditions:  Cardiac failure   Critical care was time spent personally by me on the following activities:  Development of treatment plan with patient or surrogate, discussions with consultants, evaluation of patient's response to treatment, examination of patient, ordering and review of laboratory studies, ordering and review of radiographic studies, ordering and performing treatments and interventions, pulse oximetry,  re-evaluation of patient's condition, review of old charts and obtaining history from patient or surrogate   I assumed direction of critical care for this patient from another provider in my specialty: yes     Care discussed with: admitting provider   Comments:       Final diagnoses:  Chest discomfort  Elevated troponin      Eber Hong, MD 02/20/22 [phone]

## 2022-02-18 NOTE — Progress Notes (Signed)
ANTICOAGULATION CONSULT NOTE - Follow-up Consult  Pharmacy Consult for heparin Indication: chest pain/ACS  No Known Allergies  Patient Measurements: Height: 4' 8.5" (143.5 cm) Weight: 83.3 kg (183 lb 10.3 oz) IBW/kg (Calculated) : 37.45 Heparin Dosing Weight: 81kg  Vital Signs: Temp: 98.1 F (36.7 C) (05/28 1619) Temp Source: Oral (05/28 1619) BP: 113/69 (05/28 1619) Pulse Rate: 61 (05/28 1619)  Labs: Recent Labs    02/18/22 0450 02/18/22 0713 02/18/22 1819  HGB 14.3  --   --   HCT 44.3  --   --   PLT 211  --   --   HEPARINUNFRC  --   --  0.36  CREATININE 0.74  --   --   TROPONINIHS 36* 182*  --      Estimated Creatinine Clearance: 52.7 mL/min (by C-G formula based on SCr of 0.74 mg/dL).   Medical History: History reviewed. No pertinent past medical history.  Assessment: 33 YOF presenting with CP and elevated troponin with concern for ACS. No anticoagulation PTA. Pharmacy consulted to dose heparin.   Heparin level therapeutic at 0.36 on heparin 1000 units/hr. No issues noted with infusion and no s/sx of bleeding noted.   Goal of Therapy:  Heparin level 0.3-0.7 units/ml Monitor platelets by anticoagulation protocol: Yes   Plan:  Continue Heparin gtt at 1000 units/hr Daily CBC and heparin level  F/u plans for cath (likely on Tuesday 5/30) and plans for [facility]   Thank you for allowing pharmacy to participate in this patient's care.  Marja Kays, PharmD PGY1 Acute Care Resident  02/18/2022,7:05 PM

## 2022-02-18 NOTE — H&P (Signed)
Physician History and Physical     Patient ID: Cathy Love MRN: [id number] DOB/AGE: 1946/03/04 76 y.o. Admit date: 02/18/2022  Primary Care Physician: Patient, No Pcp Per (Inactive) Primary Cardiologist: New/Amaka Gluth  Principal Problem:   SEMI (subendocardial myocardial infarction) [facility]) Active Problems:   Unstable angina (HCC)   HPI:  76 y.o. admitted at request of Dr Hyacinth Meeker for [facility]. Healthy female on no meds. 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 No associated GI symptoms , palpitations although has PVCls on telemetry No dyspnea pleurisy fever or recent trauma. ECG subtle lateral T wave inversions in I,AVL no acute ST elevation CXR NAD   Review of systems complete and found to be negative unless listed above   History reviewed. No pertinent past medical history.  History reviewed. No pertinent family history.  Social History   Socioeconomic History   Marital status: Married    Spouse name: Not on file   Number of children: Not on file   Years of education: Not on file   Highest education level: Not on file  Occupational History   Not on file  Tobacco Use   Smoking status: Never   Smokeless tobacco: Never  Substance and Sexual Activity   Alcohol use: Not on file   Drug use: Not on file   Sexual activity: Not on file  Other Topics Concern   Not on file  Social History Narrative   Not on file   Social Determinants of Health   Financial Resource Strain: Not on file  Food Insecurity: Not on file  Transportation Needs: Not on file  Physical Activity: Not on file  Stress: Not on file  Social Connections: Not on file  Intimate Partner Violence: Not on file    History reviewed.  No pertinent surgical history.   (Not in a hospital admission)   Physical Exam:   BP (!) 136/55   Pulse 70   Temp 97.8 F (36.6 C) (Oral)   Resp 16   Wt 83.9 kg   SpO2 99%   BMI 40.03 kg/m   Affect appropriate Healthy:  appears stated age HEENT: normal Neck supple with no adenopathy JVP normal no bruits no thyromegaly Lungs clear with no wheezing and good diaphragmatic motion Heart:  S1/S2 no murmur, no rub, gallop or click PMI normal Abdomen: benighn, BS positve, no tenderness, no AAA no bruit.  No HSM or HJR Distal pulses intact with no bruits No edema Neuro non-focal Skin warm and dry No muscular weakness  No current facility-administered medications on file prior to encounter.   Current Outpatient Medications on File Prior to Encounter  Medication Sig Dispense Refill   ibuprofen (ADVIL) 200 MG tablet Take 400 mg by mouth every 6 (six) hours as needed for headache, fever or moderate pain.     aspirin EC 81 MG tablet Take 324 mg by mouth once. Swallow whole.     COVID-19 mRNA bivalent vaccine, Moderna, (MODERNA COVID-19 BIVAL BOOSTER) 50 MCG/0.5ML injection Inject into the muscle. (Patient not taking: Reported on 02/18/2022) 0.5 mL 0   influenza vaccine adjuvanted (FLUAD QUADRIVALENT) 0.5 ML injection Inject into the muscle. (Patient not taking: Reported on 02/18/2022) 0.5 mL 0    Labs:  Lab Results  Component Value Date   WBC 5.2 02/18/2022   HGB 14.3 02/18/2022   HCT 44.3 02/18/2022   MCV 92.5 02/18/2022   PLT 211 02/18/2022    Recent Labs  Lab 02/18/22 0450  NA 140  K 4.3  CL 108  CO2 23  BUN 18  CREATININE 0.74  CALCIUM 8.8*  GLUCOSE 113*       Radiology: DG Chest Port 1 View  Result Date: 02/18/2022 CLINICAL DATA:  Chest pain EXAM: PORTABLE CHEST 1 VIEW COMPARISON:  None Available. FINDINGS: 0538 hours. The lungs are clear without focal pneumonia, edema, pneumothorax or pleural effusion. Interstitial markings are diffusely coarsened with  chronic features. Cardiopericardial silhouette is at upper limits of normal for size. The visualized bony structures of the thorax are unremarkable. Telemetry leads overlie the chest. IMPRESSION: No active disease. Electronically Signed   By: Kennith Center M.D.   On: 02/18/2022 05:56    EKG: See HPI  ASSESSMENT AND PLAN:   1. SEMI:  worrisome new onset pain with ECG changes and trending up troponin No other health issues to explain pain Shared decision making favor diagnostic cath Tuesday ( Monday is holiday }  Start heparin high dose statin and low dose lopressor 12.5 mg bid with PVCls TTE to assess EF Risks of cath including stroke, bleeding contrast reaction intubation need for emergency surgery discussed willing to proceed Orders written and added on to cath schedule  Signed:   Theron Arista Nishan5/28/2023, 11:04 AM

## 2022-02-18 NOTE — ED Triage Notes (Signed)
Pt to ED via EMS. Pt c/o central CP but has since resolved PTA. Pt c/o headache and jaw pain around 9pm that lasted about 2 hours. Pt also c/o numbness / tingling in bilateral arms that has resolved PTA. No prior cardiac hx. Pt has no complaints upon arrival to ED.    EMS Vitals: 142/86 HR 68 97% RA 122 CBG

## 2022-02-18 NOTE — Plan of Care (Signed)

## 2022-02-19 DIAGNOSIS — I214 Non-ST elevation (NSTEMI) myocardial infarction: Secondary | ICD-10-CM | POA: Diagnosis not present

## 2022-02-19 LAB — LIPID PANEL
Cholesterol: 220 mg/dL — ABNORMAL HIGH (ref 0–200)
HDL: 42 mg/dL (ref >40)
LDL Cholesterol: 144 mg/dL — ABNORMAL HIGH (ref 0–99)
Total CHOL/HDL Ratio: 5.2 RATIO
Triglycerides: 172 mg/dL — ABNORMAL HIGH (ref <150)
VLDL: 34 mg/dL (ref 0–40)

## 2022-02-19 LAB — CBC
HCT: 43.4 % (ref 36.0–46.0)
Hemoglobin: 14.2 g/dL (ref 12.0–15.0)
MCH: 29.7 pg (ref 26.0–34.0)
MCHC: 32.7 g/dL (ref 30.0–36.0)
MCV: 90.8 fL (ref 80.0–100.0)
Platelets: 217 10*3/uL (ref 150–400)
RBC: 4.78 MIL/uL (ref 3.87–5.11)
RDW: 13.6 % (ref 11.5–15.5)
WBC: 6.8 10*3/uL (ref 4.0–10.5)
nRBC: 0 % (ref 0.0–0.2)

## 2022-02-19 LAB — BASIC METABOLIC PANEL
Anion gap: 6 (ref 5–15)
BUN: 11 mg/dL (ref 8–23)
CO2: 26 mmol/L (ref 22–32)
Calcium: 9.1 mg/dL (ref 8.9–10.3)
Chloride: 109 mmol/L (ref 98–111)
Creatinine, Ser: 0.77 mg/dL (ref 0.44–1.00)
GFR, Estimated: >60 mL/min (ref >60)
Glucose, Bld: 103 mg/dL — ABNORMAL HIGH (ref 70–99)
Potassium: 4.2 mmol/L (ref 3.5–5.1)
Sodium: 141 mmol/L (ref 135–145)

## 2022-02-19 LAB — HEPARIN LEVEL (UNFRACTIONATED): Heparin Unfractionated: 0.34 IU/mL (ref 0.30–0.70)

## 2022-02-19 LAB — PROTIME-INR
INR: 1 (ref 0.8–1.2)
Prothrombin Time: 13 seconds (ref 11.4–15.2)

## 2022-02-19 MED ORDER — SODIUM CHLORIDE 0.9% FLUSH: 3.0000 mL | INTRAVENOUS | Status: DC | PRN

## 2022-02-19 MED ORDER — SODIUM CHLORIDE 0.9 % WEIGHT BASED INFUSION
3.0000 mL/kg/h | INTRAVENOUS | Status: DC
  Administered 2022-02-20: 3 mL/kg/h via INTRAVENOUS

## 2022-02-19 MED ORDER — SODIUM CHLORIDE 0.9 % IV SOLN: 250.0000 mL | INTRAVENOUS | Status: DC | PRN

## 2022-02-19 MED ORDER — SODIUM CHLORIDE 0.9 % WEIGHT BASED INFUSION
1.0000 mL/kg/h | INTRAVENOUS | Status: DC
  Administered 2022-02-20: 1 mL/kg/h via INTRAVENOUS

## 2022-02-19 MED ORDER — ASPIRIN 81 MG PO CHEW
81.0000 mg | CHEWABLE_TABLET | ORAL | Status: AC
  Administered 2022-02-20: 81 mg via ORAL
  Filled 2022-02-19: qty 1

## 2022-02-19 NOTE — Plan of Care (Signed)

## 2022-02-19 NOTE — Progress Notes (Signed)
Progress Note  Patient Name: Cathy Love Date of Encounter: 02/19/2022  [facility] Cardiologist: Charlton Haws  Subjective   Quiet night.  No chest pain.  Discussed findings.  Reviewed cath and risk.  We will proceed with cath tomorrow.  Inpatient Medications    Scheduled Meds:  aspirin  324 mg Oral NOW   Or   aspirin  300 mg Rectal NOW   aspirin EC  81 mg Oral Daily   atorvastatin  80 mg Oral Daily   metoprolol tartrate  12.5 mg Oral BID   sodium chloride flush  3 mL Intravenous Q12H   Continuous Infusions:  sodium chloride     heparin 1,000 Units/hr (02/19/22 0804)   PRN Meds: acetaminophen, nitroGLYCERIN, ondansetron (ZOFRAN) IV   Vital Signs    Vitals:   02/18/22 1619 02/18/22 1949 02/19/22 0451 02/19/22 0746  BP: 113/69 114/70 121/70 112/80  Pulse: 61 67 67 64  Resp: Temp: 98.1 F (36.7 C) 98.6 F (37 C) (!) 97 F (36.1 C) 98.4 F (36.9 C)  TempSrc: Oral Oral Oral Oral  SpO2: 94% 95% 96% 94%  Weight:   83 kg   Height:        Intake/Output Summary (Last 24 hours) at 02/19/2022 1033 Last data filed at 02/19/2022 0747 Gross per 24 hour  Intake 432.98 ml  Output 1300 ml  Net -867.02 ml      02/19/2022    4:51 AM 02/18/2022   12:13 PM 02/18/2022    9:45 AM  Last 3 Weights  Weight (lbs) 182 lb 14.4 oz 183 lb 10.3 oz 185 lb  Weight (kg) 82.963 kg 83.3 kg 83.915 kg      Telemetry    Sinus rhythm and sinus bradycardia- Personally Reviewed  ECG    Admission EKG was normal on 02/18/2022.- Personally Reviewed  Physical Exam  Overweight GEN: No acute distress.   Neck: No JVD Cardiac: RRR, 1/6 right upper sternal systolic aortic valve murmur but no rubs, or gallops.  Respiratory: Clear to auscultation bilaterally. GI: Soft, nontender, non-distended  MS: No edema; No deformity. Neuro:  Nonfocal  Psych: Normal affect   Labs    High Sensitivity Troponin:   Recent Labs  Lab 02/18/22 0450 02/18/22 0713 02/18/22 2104   TROPONINIHS 36* 182* 584*     Chemistry Recent Labs  Lab 02/18/22 0450 02/18/22 1819 02/19/22 0150  NA 140  --  141  K 4.3  --  4.2  CL 108  --  109  CO2 23  --  26  GLUCOSE 113*  --  103*  BUN 18  --  11  CREATININE 0.74  --  0.77  CALCIUM 8.8*  --  9.1  MG  --  2.1  --   PROT  --  6.0*  --   ALBUMIN  --  3.2*  --   AST  --  19  --   ALT  --  13  --   ALKPHOS  --  34*  --   BILITOT  --  0.5  --   GFRNONAA >60  --  >60  ANIONGAP 9  --  6    Lipids  Recent Labs  Lab 02/19/22 0150  CHOL 220*  TRIG 172*  HDL 42  LDLCALC 144*  CHOLHDL 5.2    Hematology Recent Labs  Lab 02/18/22 0450 02/19/22 0150  WBC 5.2 6.8  RBC 4.79 4.78  HGB 14.3 14.2  HCT 44.3  43.4  MCV 92.5 90.8  MCH 29.9 29.7  MCHC 32.3 32.7  RDW 13.4 13.6  PLT 211 217   Thyroid  Recent Labs  Lab 02/18/22 1819  TSH 0.888    BNPNo results for input(s): BNP, PROBNP in the last 168 hours.  DDimer No results for input(s): DDIMER in the last 168 hours.   Radiology    DG Chest Port 1 View  Result Date: 02/18/2022 CLINICAL DATA:  Chest pain EXAM: PORTABLE CHEST 1 VIEW COMPARISON:  None Available. FINDINGS: 0538 hours. The lungs are clear without focal pneumonia, edema, pneumothorax or pleural effusion. Interstitial markings are diffusely coarsened with chronic features. Cardiopericardial silhouette is at upper limits of normal for size. The visualized bony structures of the thorax are unremarkable. Telemetry leads overlie the chest. IMPRESSION: No active disease. Electronically Signed   By: Kennith CenterEric  Mansell M.D.   On: 02/18/2022 05:56   ECHOCARDIOGRAM COMPLETE  Result Date: 02/18/2022    ECHOCARDIOGRAM REPORT   Patient Name:   Cathy Love Date of Exam: 02/18/2022 Medical Rec #:  [id number]        Height:       56.5 in Accession #:    [id number]       Weight:       183.6 lb Date of Birth:  03-09-1946       BSA:          1.723 m Patient Age:    76 years         BP:           134/85 mmHg Patient Gender:  F                HR:           60 bpm. Exam Location:  Inpatient Procedure: 2D Echo, Cardiac Doppler, Color Doppler and Strain Analysis Indications:    Chest pain                 MI  History:        Patient has no prior history of Echocardiogram examinations.  Sonographer:    Neomia DearAMARA CROWN RDCS Referring Phys: [id number] LAURA R INGOLD  Sonographer Comments: Suboptimal parasternal window, suboptimal apical window and suboptimal subcostal window. IMPRESSIONS  1. Left ventricular ejection fraction, by estimation, is 60 to 65%. The left ventricle has normal function. The left ventricle has no regional wall motion abnormalities. Left ventricular diastolic parameters were normal.  2. Right ventricular systolic function is normal. The right ventricular size is normal.  3. The mitral valve is normal in structure. No evidence of mitral valve regurgitation. No evidence of mitral stenosis.  4. The aortic valve is tricuspid. There is mild calcification of the aortic valve. There is mild thickening of the aortic valve. Aortic valve regurgitation is not visualized. Aortic valve sclerosis is present, with no evidence of aortic valve stenosis.  5. The inferior vena cava is normal in size with greater than 50% respiratory variability, suggesting right atrial pressure of 3 mmHg. FINDINGS  Left Ventricle: Left ventricular ejection fraction, by estimation, is 60 to 65%. The left ventricle has normal function. The left ventricle has no regional wall motion abnormalities. The left ventricular internal cavity size was normal in size. There is  no left ventricular hypertrophy. Left ventricular diastolic parameters were normal. Right Ventricle: The right ventricular size is normal. No increase in right ventricular wall thickness. Right ventricular systolic function is normal. Left Atrium: Left atrial size was normal in size. Right  Atrium: Right atrial size was normal in size. Pericardium: There is no evidence of pericardial effusion. Mitral Valve:  The mitral valve is normal in structure. No evidence of mitral valve regurgitation. No evidence of mitral valve stenosis. Tricuspid Valve: The tricuspid valve is normal in structure. Tricuspid valve regurgitation is not demonstrated. No evidence of tricuspid stenosis. Aortic Valve: The aortic valve is tricuspid. There is mild calcification of the aortic valve. There is mild thickening of the aortic valve. Aortic valve regurgitation is not visualized. Aortic valve sclerosis is present, with no evidence of aortic valve stenosis. Aortic valve mean gradient measures 4.0 mmHg. Aortic valve peak gradient measures 7.8 mmHg. Aortic valve area, by VTI measures 1.73 cm. Pulmonic Valve: The pulmonic valve was normal in structure. Pulmonic valve regurgitation is not visualized. No evidence of pulmonic stenosis. Aorta: The aortic root is normal in size and structure. Venous: The inferior vena cava is normal in size with greater than 50% respiratory variability, suggesting right atrial pressure of 3 mmHg. IAS/Shunts: No atrial level shunt detected by color flow Doppler.  LEFT VENTRICLE PLAX 2D LVIDd:         4.00 cm     Diastology LVIDs:         1.90 cm     LV e' medial:    7.43 cm/s LV PW:         1.30 cm     LV E/e' medial:  5.7 LV IVS:        1.20 cm     LV e' lateral:   6.83 cm/s LVOT diam:     2.10 cm     LV E/e' lateral: 6.3 LV SV:         52 LV SV Index:   30 LVOT Area:     3.46 cm  LV Volumes (MOD) LV vol d, MOD A2C: 41.3 ml LV vol d, MOD A4C: 36.6 ml LV vol s, MOD A2C: 22.7 ml LV vol s, MOD A4C: 19.9 ml LV SV MOD A2C:     18.6 ml LV SV MOD A4C:     36.6 ml LV SV MOD BP:      18.7 ml RIGHT VENTRICLE RV S prime:     9.60 cm/s TAPSE (M-mode): 1.6 cm LEFT ATRIUM             Index LA diam:        3.40 cm 1.97 cm/m LA Vol (A2C):   33.0 ml 19.15 ml/m LA Vol (A4C):   44.9 ml 26.05 ml/m LA Biplane Vol: 39.7 ml 23.04 ml/m  AORTIC VALVE                    PULMONIC VALVE AV Area (Vmax):    1.81 cm     PV Vmax:       0.77 m/s  AV Area (Vmean):   1.71 cm     PV Vmean:      55.000 cm/s AV Area (VTI):     1.73 cm     PV VTI:        0.191 m AV Vmax:           140.00 cm/s  PV Peak grad:  2.4 mmHg AV Vmean:          94.300 cm/s  PV Mean grad:  1.0 mmHg AV VTI:            0.301 m AV Peak Grad:      7.8 mmHg AV  Mean Grad:      4.0 mmHg LVOT Vmax:         73.20 cm/s LVOT Vmean:        46.600 cm/s LVOT VTI:          0.150 m LVOT/AV VTI ratio: 0.50  AORTA Ao Root diam: 2.40 cm Ao Asc diam:  2.70 cm MITRAL VALVE MV Area (PHT): 2.99 cm    SHUNTS MV Decel Time: 254 msec    Systemic VTI:  0.15 m MV E velocity: 42.70 cm/s  Systemic Diam: 2.10 cm MV A velocity: 70.30 cm/s MV E/A ratio:  0.61 Charlton Haws MD Electronically signed by Charlton Haws MD Signature Date/Time: 02/18/2022/3:49:03 PM    Final     Cardiac Studies   2D Doppler echocardiogram: See above.  EF 65 without regional wall motion abnormality.  Patient Profile     76 y.o. female no prior history of CAD and no significant risk factors other than hyperlipidemia admitted with non-ST elevation myocardial infarction.  Assessment & Plan    Non-ST elevation MI: Aggressive risk factor modification including high intensity statin therapy.  Consider Vascepa.  Proceed with coronary angiography to rule out obstructive disease versus MINOCA.  N.p.o. after midnight and precath orders written.  Continue aspirin.  Orders have already been written. Hyperlipidemia: Continue rosuvastatin 80 mg daily.    The patient was counseled to undergo left heart catheterization, coronary angiography, and possible percutaneous coronary intervention with stent implantation. The procedural risks and benefits were discussed in detail. The risks discussed include death, stroke, myocardial infarction, life-threatening bleeding, limb ischemia, kidney injury, allergy, and possible emergency cardiac surgery. The risk of these significant complications were estimated to occur less than 1% of the time. After  re-discussion, the patient has agreed to proceed.   For questions or updates, please contact [facility] Please consult [email or web address] for contact info under        Signed, Lesleigh Noe, MD  02/19/2022, 10:33 AM

## 2022-02-19 NOTE — Progress Notes (Signed)
ANTICOAGULATION CONSULT NOTE - Follow-up Consult  Pharmacy Consult for heparin Indication: chest pain/ACS  No Known Allergies  Patient Measurements: Height: 4' 8.5" (143.5 cm) Weight: 83 kg (182 lb 14.4 oz) (Scale c) IBW/kg (Calculated) : 37.45 Heparin Dosing Weight: 81kg  Vital Signs: Temp: 97 F (36.1 C) (05/29 0451) Temp Source: Oral (05/29 0451) BP: 121/70 (05/29 0451) Pulse Rate: 67 (05/29 0451)  Labs: Recent Labs    02/18/22 0450 02/18/22 0713 02/18/22 1819 02/18/22 2104 02/19/22 0150  HGB 14.3  --   --   --  14.2  HCT 44.3  --   --   --  43.4  PLT 211  --   --   --  217  LABPROT  --   --   --   --  13.0  INR  --   --   --   --  1.0  HEPARINUNFRC  --   --  0.36  --  0.34  CREATININE 0.74  --   --   --  0.77  TROPONINIHS 36* 182*  --  584*  --      Estimated Creatinine Clearance: 52.6 mL/min (by C-G formula based on SCr of 0.77 mg/dL).   Medical History: History reviewed. No pertinent past medical history.  Assessment: 15 YOF presenting with CP and elevated troponin with concern for ACS. No anticoagulation PTA. Pharmacy consulted to dose heparin.   Heparin level therapeutic at 0.34 on heparin 1000 units/hr. No issues noted with infusion and no s/sx of bleeding noted. CBC stable.  Goal of Therapy:  Heparin level 0.3-0.7 units/ml Monitor platelets by anticoagulation protocol: Yes   Plan:  Continue Heparin gtt at 1000 units/hr Daily CBC and heparin level  F/u plans for cath (likely on Tuesday 5/30) and plans for [facility]   Thank you for allowing pharmacy to participate in this patient's care.  Enos Fling, PharmD PGY1 Pharmacy Resident 02/19/2022 7:25 AM Check [email or web address] for unit specific pharmacy number

## 2022-02-20 ENCOUNTER — Encounter (HOSPITAL_COMMUNITY): Payer: Self-pay | Admitting: Interventional Cardiology

## 2022-02-20 ENCOUNTER — Inpatient Hospital Stay (HOSPITAL_COMMUNITY): Payer: PPO

## 2022-02-20 ENCOUNTER — Inpatient Hospital Stay (HOSPITAL_COMMUNITY)
Admission: EM | Disposition: A | Discharge status: Home Health | Payer: Self-pay | Source: Home / Self Care | Attending: Cardiothoracic Surgery

## 2022-02-20 DIAGNOSIS — I2510 Atherosclerotic heart disease of native coronary artery without angina pectoris: Secondary | ICD-10-CM | POA: Diagnosis not present

## 2022-02-20 DIAGNOSIS — Z9049 Acquired absence of other specified parts of digestive tract: Secondary | ICD-10-CM

## 2022-02-20 DIAGNOSIS — R778 Other specified abnormalities of plasma proteins: Secondary | ICD-10-CM

## 2022-02-20 DIAGNOSIS — R0789 Other chest pain: Principal | ICD-10-CM

## 2022-02-20 DIAGNOSIS — I214 Non-ST elevation (NSTEMI) myocardial infarction: Secondary | ICD-10-CM | POA: Diagnosis not present

## 2022-02-20 HISTORY — PX: LEFT HEART CATH AND CORONARY ANGIOGRAPHY: CATH118249

## 2022-02-20 LAB — URINALYSIS, ROUTINE W REFLEX MICROSCOPIC
Bacteria, UA: NONE SEEN
Bilirubin Urine: NEGATIVE
Glucose, UA: NEGATIVE mg/dL
Hgb urine dipstick: NEGATIVE
Ketones, ur: NEGATIVE mg/dL
Nitrite: NEGATIVE
Protein, ur: NEGATIVE mg/dL
Specific Gravity, Urine: 1.026 (ref 1.005–1.030)
pH: 7 (ref 5.0–8.0)

## 2022-02-20 LAB — CBC
HCT: 40.6 % (ref 36.0–46.0)
HCT: 43.9 % (ref 36.0–46.0)
Hemoglobin: 13.2 g/dL (ref 12.0–15.0)
Hemoglobin: 14 g/dL (ref 12.0–15.0)
MCH: 29.7 pg (ref 26.0–34.0)
MCH: 29.4 pg (ref 26.0–34.0)
MCHC: 32.5 g/dL (ref 30.0–36.0)
MCHC: 31.9 g/dL (ref 30.0–36.0)
MCV: 91.4 fL (ref 80.0–100.0)
MCV: 92.2 fL (ref 80.0–100.0)
Platelets: 181 10*3/uL (ref 150–400)
Platelets: 210 10*3/uL (ref 150–400)
RBC: 4.44 MIL/uL (ref 3.87–5.11)
RBC: 4.76 MIL/uL (ref 3.87–5.11)
RDW: 13.6 % (ref 11.5–15.5)
RDW: 13.7 % (ref 11.5–15.5)
WBC: 5.8 10*3/uL (ref 4.0–10.5)
WBC: 6 10*3/uL (ref 4.0–10.5)
nRBC: 0 % (ref 0.0–0.2)
nRBC: 0 % (ref 0.0–0.2)

## 2022-02-20 LAB — HEPATIC FUNCTION PANEL
ALT: 15 U/L (ref 0–44)
AST: 22 U/L (ref 15–41)
Albumin: 3.3 g/dL — ABNORMAL LOW (ref 3.5–5.0)
Alkaline Phosphatase: 45 U/L (ref 38–126)
Bilirubin, Direct: <0.1 mg/dL (ref 0.0–0.2)
Total Bilirubin: 0.4 mg/dL (ref 0.3–1.2)
Total Protein: 6.3 g/dL — ABNORMAL LOW (ref 6.5–8.1)

## 2022-02-20 LAB — BLOOD GAS, ARTERIAL
Acid-Base Excess: 6 mmol/L — ABNORMAL HIGH (ref 0.0–2.0)
Bicarbonate: 30.6 mmol/L — ABNORMAL HIGH (ref 20.0–28.0)
Drawn by: 38235
O2 Saturation: 95.2 %
Patient temperature: 37
pCO2 arterial: 43 mmHg (ref 32–48)
pH, Arterial: 7.46 — ABNORMAL HIGH (ref 7.35–7.45)
pO2, Arterial: 72 mmHg — ABNORMAL LOW (ref 83–108)

## 2022-02-20 LAB — ABO/RH: ABO/RH(D): O POS

## 2022-02-20 LAB — SURGICAL PCR SCREEN
MRSA, PCR: NEGATIVE
Staphylococcus aureus: NEGATIVE

## 2022-02-20 LAB — BASIC METABOLIC PANEL
Anion gap: 6 (ref 5–15)
BUN: 16 mg/dL (ref 8–23)
CO2: 26 mmol/L (ref 22–32)
Calcium: 8.7 mg/dL — ABNORMAL LOW (ref 8.9–10.3)
Chloride: 105 mmol/L (ref 98–111)
Creatinine, Ser: 0.69 mg/dL (ref 0.44–1.00)
GFR, Estimated: >60 mL/min (ref >60)
Glucose, Bld: 100 mg/dL — ABNORMAL HIGH (ref 70–99)
Potassium: 4.2 mmol/L (ref 3.5–5.1)
Sodium: 137 mmol/L (ref 135–145)

## 2022-02-20 LAB — PREPARE RBC (CROSSMATCH)

## 2022-02-20 LAB — HEPARIN LEVEL (UNFRACTIONATED): Heparin Unfractionated: 0.37 IU/mL (ref 0.30–0.70)

## 2022-02-20 LAB — TSH: TSH: 1.171 u[IU]/mL (ref 0.350–4.500)

## 2022-02-20 SURGERY — LEFT HEART CATH AND CORONARY ANGIOGRAPHY
Anesthesia: LOCAL

## 2022-02-20 MED ORDER — HEPARIN (PORCINE) IN NACL 1000-0.9 UT/500ML-% IV SOLN
INTRAVENOUS | Status: AC
  Filled 2022-02-20: qty 1000

## 2022-02-20 MED ORDER — CHLORHEXIDINE GLUCONATE 4 % EX LIQD
60.0000 mL | Freq: Once | CUTANEOUS | Status: AC
  Administered 2022-02-20: 4 via TOPICAL
  Filled 2022-02-20: qty 60

## 2022-02-20 MED ORDER — FENTANYL CITRATE (PF) 100 MCG/2ML IJ SOLN
INTRAMUSCULAR | Status: DC | PRN
  Administered 2022-02-20: 25 ug via INTRAVENOUS

## 2022-02-20 MED ORDER — EPINEPHRINE HCL 5 MG/250ML IV SOLN IN NS
0.0000 ug/min | INTRAVENOUS | Status: DC
  Filled 2022-02-20: qty 250

## 2022-02-20 MED ORDER — ALPRAZOLAM 0.25 MG PO TABS: 0.2500 mg | ORAL_TABLET | ORAL | Status: DC | PRN

## 2022-02-20 MED ORDER — PHENYLEPHRINE HCL-NACL 20-0.9 MG/250ML-% IV SOLN
30.0000 ug/min | INTRAVENOUS | Status: AC
  Administered 2022-02-21: 25 ug/min via INTRAVENOUS
  Filled 2022-02-20: qty 250

## 2022-02-20 MED ORDER — PLASMA-LYTE A IV SOLN
INTRAVENOUS | Status: DC
  Filled 2022-02-20: qty 2.5

## 2022-02-20 MED ORDER — LIDOCAINE HCL (PF) 1 % IJ SOLN
INTRAMUSCULAR | Status: AC
  Filled 2022-02-20: qty 30

## 2022-02-20 MED ORDER — SODIUM CHLORIDE 0.9 % IV SOLN: 250.0000 mL | INTRAVENOUS | Status: DC | PRN

## 2022-02-20 MED ORDER — ACETAMINOPHEN 325 MG PO TABS
650.0000 mg | ORAL_TABLET | ORAL | Status: DC | PRN
Start: 2022-02-20 — End: 2022-02-21

## 2022-02-20 MED ORDER — SODIUM CHLORIDE 0.9% FLUSH
3.0000 mL | Freq: Two times a day (BID) | INTRAVENOUS | Status: DC
  Administered 2022-02-20: 3 mL via INTRAVENOUS

## 2022-02-20 MED ORDER — TRANEXAMIC ACID 1000 MG/10ML IV SOLN
1.5000 mg/kg/h | INTRAVENOUS | Status: AC
  Administered 2022-02-21: 1.5 mg/kg/h via INTRAVENOUS
  Filled 2022-02-20: qty 25

## 2022-02-20 MED ORDER — POTASSIUM CHLORIDE 2 MEQ/ML IV SOLN
80.0000 meq | INTRAVENOUS | Status: DC
  Filled 2022-02-20: qty 40

## 2022-02-20 MED ORDER — METOPROLOL TARTRATE 12.5 MG HALF TABLET
12.5000 mg | ORAL_TABLET | Freq: Once | ORAL | Status: AC
  Administered 2022-02-21: 12.5 mg via ORAL
  Filled 2022-02-20: qty 1

## 2022-02-20 MED ORDER — LABETALOL HCL 5 MG/ML IV SOLN: 10.0000 mg | INTRAVENOUS | Status: AC | PRN

## 2022-02-20 MED ORDER — TEMAZEPAM 15 MG PO CAPS
15.0000 mg | ORAL_CAPSULE | Freq: Once | ORAL | Status: AC | PRN
Start: 2022-02-20 — End: 2022-02-20
  Administered 2022-02-20: 15 mg via ORAL
  Filled 2022-02-20: qty 1

## 2022-02-20 MED ORDER — CHLORHEXIDINE GLUCONATE 0.12 % MT SOLN
15.0000 mL | Freq: Once | OROMUCOSAL | Status: AC
  Administered 2022-02-21: 15 mL via OROMUCOSAL
  Filled 2022-02-20: qty 15

## 2022-02-20 MED ORDER — HEPARIN SODIUM (PORCINE) 1000 UNIT/ML IJ SOLN
INTRAMUSCULAR | Status: AC
  Filled 2022-02-20: qty 10

## 2022-02-20 MED ORDER — ATORVASTATIN CALCIUM 80 MG PO TABS
80.0000 mg | ORAL_TABLET | Freq: Every day | ORAL | Status: DC
  Administered 2022-02-20 – 2022-02-26 (×6): 80 mg via ORAL
  Filled 2022-02-20 (×6): qty 1

## 2022-02-20 MED ORDER — ONDANSETRON HCL 4 MG/2ML IJ SOLN: 4.0000 mg | Freq: Four times a day (QID) | INTRAMUSCULAR | Status: DC | PRN

## 2022-02-20 MED ORDER — MIDAZOLAM HCL 2 MG/2ML IJ SOLN
INTRAMUSCULAR | Status: DC | PRN
  Administered 2022-02-20: 1 mg via INTRAVENOUS

## 2022-02-20 MED ORDER — OXYCODONE HCL 5 MG PO TABS: 5.0000 mg | ORAL_TABLET | ORAL | Status: DC | PRN

## 2022-02-20 MED ORDER — HYDRALAZINE HCL 20 MG/ML IJ SOLN: 10.0000 mg | INTRAMUSCULAR | Status: AC | PRN

## 2022-02-20 MED ORDER — BISACODYL 5 MG PO TBEC: 5.0000 mg | DELAYED_RELEASE_TABLET | Freq: Once | ORAL | Status: DC

## 2022-02-20 MED ORDER — CEFAZOLIN SODIUM-DEXTROSE 2-4 GM/100ML-% IV SOLN
2.0000 g | INTRAVENOUS | Status: DC
  Administered 2022-02-21: 2 g via INTRAVENOUS
  Filled 2022-02-20: qty 100

## 2022-02-20 MED ORDER — IOHEXOL 350 MG/ML SOLN
INTRAVENOUS | Status: DC | PRN
  Administered 2022-02-20: 65 mL

## 2022-02-20 MED ORDER — VANCOMYCIN HCL 1250 MG/250ML IV SOLN
1250.0000 mg | INTRAVENOUS | Status: AC
  Administered 2022-02-21: 1250 mg via INTRAVENOUS
  Filled 2022-02-20: qty 250

## 2022-02-20 MED ORDER — CHLORHEXIDINE GLUCONATE 4 % EX LIQD
60.0000 mL | Freq: Once | CUTANEOUS | Status: AC
  Administered 2022-02-21: 4 via TOPICAL
  Filled 2022-02-20: qty 60

## 2022-02-20 MED ORDER — MILRINONE LACTATE IN DEXTROSE 20-5 MG/100ML-% IV SOLN
0.3000 ug/kg/min | INTRAVENOUS | Status: AC
  Administered 2022-02-21: .25 ug/kg/min via INTRAVENOUS
  Filled 2022-02-20: qty 100

## 2022-02-20 MED ORDER — SODIUM CHLORIDE 0.9 % IV SOLN: INTRAVENOUS | Status: AC

## 2022-02-20 MED ORDER — SODIUM CHLORIDE 0.9% FLUSH
3.0000 mL | INTRAVENOUS | Status: DC | PRN
Start: 2022-02-20 — End: 2022-02-21

## 2022-02-20 MED ORDER — MIDAZOLAM HCL 2 MG/2ML IJ SOLN
INTRAMUSCULAR | Status: AC
  Filled 2022-02-20: qty 2

## 2022-02-20 MED ORDER — HEPARIN (PORCINE) IN NACL 1000-0.9 UT/500ML-% IV SOLN
INTRAVENOUS | Status: DC | PRN
  Administered 2022-02-20 (×2): 500 mL

## 2022-02-20 MED ORDER — DEXMEDETOMIDINE HCL IN NACL 400 MCG/100ML IV SOLN
0.1000 ug/kg/h | INTRAVENOUS | Status: AC
  Administered 2022-02-21: .5 ug/kg/h via INTRAVENOUS
  Filled 2022-02-20: qty 100

## 2022-02-20 MED ORDER — CEFAZOLIN SODIUM-DEXTROSE 2-4 GM/100ML-% IV SOLN
2.0000 g | INTRAVENOUS | Status: AC
  Administered 2022-02-21: 2 g via INTRAVENOUS
  Filled 2022-02-20: qty 100

## 2022-02-20 MED ORDER — HEPARIN (PORCINE) 25000 UT/250ML-% IV SOLN
1000.0000 [IU]/h | INTRAVENOUS | Status: DC
  Administered 2022-02-20: 1000 [IU]/h via INTRAVENOUS
  Filled 2022-02-20: qty 250

## 2022-02-20 MED ORDER — HEPARIN SODIUM (PORCINE) 1000 UNIT/ML IJ SOLN
INTRAMUSCULAR | Status: DC | PRN
  Administered 2022-02-20: 4000 [IU] via INTRAVENOUS

## 2022-02-20 MED ORDER — VERAPAMIL HCL 2.5 MG/ML IV SOLN
INTRAVENOUS | Status: DC | PRN
  Administered 2022-02-20: 10 mL via INTRA_ARTERIAL

## 2022-02-20 MED ORDER — MAGNESIUM SULFATE 50 % IJ SOLN
40.0000 meq | INTRAMUSCULAR | Status: DC
  Filled 2022-02-20: qty 9.85

## 2022-02-20 MED ORDER — INSULIN REGULAR(HUMAN) IN NACL 100-0.9 UT/100ML-% IV SOLN
INTRAVENOUS | Status: AC
  Administered 2022-02-21: 1.7 [IU]/h via INTRAVENOUS
  Filled 2022-02-20: qty 100

## 2022-02-20 MED ORDER — ASPIRIN 81 MG PO CHEW: 81.0000 mg | CHEWABLE_TABLET | Freq: Every day | ORAL | Status: DC

## 2022-02-20 MED ORDER — NITROGLYCERIN IN D5W 200-5 MCG/ML-% IV SOLN
2.0000 ug/min | INTRAVENOUS | Status: DC
  Filled 2022-02-20: qty 250

## 2022-02-20 MED ORDER — NOREPINEPHRINE 4 MG/250ML-% IV SOLN
0.0000 ug/min | INTRAVENOUS | Status: AC
  Administered 2022-02-21: 2 ug/min via INTRAVENOUS
  Filled 2022-02-20: qty 250

## 2022-02-20 MED ORDER — FENTANYL CITRATE (PF) 100 MCG/2ML IJ SOLN
INTRAMUSCULAR | Status: AC
  Filled 2022-02-20: qty 2

## 2022-02-20 MED ORDER — TRANEXAMIC ACID (OHS) BOLUS VIA INFUSION
15.0000 mg/kg | INTRAVENOUS | Status: AC
  Administered 2022-02-21: 1248 mg via INTRAVENOUS
  Filled 2022-02-20: qty 1248

## 2022-02-20 MED ORDER — VERAPAMIL HCL 2.5 MG/ML IV SOLN
INTRAVENOUS | Status: AC
  Filled 2022-02-20: qty 2

## 2022-02-20 MED ORDER — HEPARIN 30,000 UNITS/1000 ML (OHS) CELLSAVER SOLUTION
Status: DC
  Filled 2022-02-20: qty 1000

## 2022-02-20 MED ORDER — TRANEXAMIC ACID (OHS) PUMP PRIME SOLUTION
2.0000 mg/kg | INTRAVENOUS | Status: DC
  Filled 2022-02-20: qty 1.66

## 2022-02-20 SURGICAL SUPPLY — 10 items
BAND ZEPHYR COMPRESS 30 LONG (HEMOSTASIS) ×1 IMPLANT
CATH 5FR JL3.5 JR4 ANG PIG MP (CATHETERS) ×1 IMPLANT
GLIDESHEATH SLEND A-KIT 6F 22G (SHEATH) ×1 IMPLANT
GUIDEWIRE INQWIRE 1.5J.035X260 (WIRE) IMPLANT
INQWIRE 1.5J .035X260CM (WIRE) ×2
KIT HEART LEFT (KITS) ×2 IMPLANT
PACK CARDIAC CATHETERIZATION (CUSTOM PROCEDURE TRAY) ×2 IMPLANT
SHEATH PROBE COVER 6X72 (BAG) ×1 IMPLANT
TRANSDUCER W/STOPCOCK (MISCELLANEOUS) ×2 IMPLANT
TUBING CIL FLEX 10 FLL-RA (TUBING) ×2 IMPLANT

## 2022-02-20 NOTE — Progress Notes (Signed)
CARDIAC REHAB PHASE I   Preop education completed with pt and family. Pt given IS, able to demonstrate ~1250. Reviewed importance of IS use, sternal precautions, and ambulation post-op. Pt given in-the-tube sheet along with cardiac surgery booklet and OHS care guide. Will continue to follow pt throughout her stay.  [id number] Reynold Bowen, RN BSN 02/20/2022 2:40 PM

## 2022-02-20 NOTE — Hospital Course (Addendum)
HPI: The patient is a 76 year old female we are asked to see in cardiothoracic surgical consultation for consideration of coronary artery surgical revascularization.  The patient presented on 02/18/2022 to the emergency department after developing some jaw pain.  Subsequent to that she developed a tight feeling in her chest.  At that time it did not radiate.  There is no associated dyspnea, nausea, or diaphoresis.  She called EMS and was also told to take an aspirin and at time of arrival symptoms had resolved.  EKG showed subtle lateral T wave inversions in leads I, aVL and no acute ST elevation.  Peak high-sensitivity troponin, and was 584. She ruled in for a NSTEMI.  She has no significant history of cardiac disease.  She also has no significant past medical history.  Cardiology consultation was obtained and she was admitted for further diagnostic evaluation and medical stabilization.  She is found to have severe three-vessel coronary artery disease with preserved left ventricular ejection fraction.  There is mid anterior wall focal hypokinesis.  LVEDP is in the normal range.  She has been started on aspirin, statin and beta-blocker.  Full report as described below.  Echocardiogram is also been performed and left ventricular ejection fraction by estimation is in the 60 to 65% range.  There are no significant valvular findings.  Full report is also as described below.  She is currently on heparin.  Hospital Course: She underwent a CABG x 3. She was transported from the OR to [facility] ICU in stable condition. She was extubated the evening of surgery. Theone Murdoch and a line were removed early in her post op course. Chest tubes and foley remained for a few days then were removed. She was on Milrinone drip and co ox were checked daily. She was started on Lopressor and Milrinone was later stopped. She was volume overloaded and diuresed accordingly. She was transitioned off the Insulin drip. Her pre op HGA1C was 5.6. Accu  checks and SS PRN will be stopped at transfer. She had gas/bloating and was given Simethicone at her request. She then had diarrhea so stool softeners were stopped. She had expected post op blood loss anemia and was transfused on 06/02.  Follow-up CBC showed appropriate rise in hematocrit which remained stable.  She was transferred to 4E progressive care on 02/24/2022.  She continued to gain strength and endurance with mobility.  She remained in a stable sinus rhythm.  The epicardial pacing wires were removed on postop day 4.  She was diuresed for expected volume excess.  After evaluation by the physical therapy team, it was determined she would probably benefit from home PT services along with a rolling walker and bedside commode.  Home health PT was arranged as well as equipment. She is felt surgically stable for discharge today.

## 2022-02-20 NOTE — Progress Notes (Signed)
ANTICOAGULATION CONSULT NOTE - Follow-up Consult  Pharmacy Consult for heparin Indication: chest pain/ACS  No Known Allergies  Patient Measurements: Height: 4' 8.5" (143.5 cm) Weight: 83.2 kg (183 lb 6.4 oz) IBW/kg (Calculated) : 37.45 Heparin Dosing Weight: 81kg  Vital Signs: Temp: 97.9 F (36.6 C) (05/30 0913) Temp Source: Oral (05/30 0913) BP: 136/84 (05/30 0913) Pulse Rate: 64 (05/30 0913)  Labs: Recent Labs    02/18/22 0450 02/18/22 0713 02/18/22 1819 02/18/22 2104 02/19/22 0150 02/20/22 0342 02/20/22 0626  HGB 14.3  --   --   --  14.2 13.2 14.0  HCT 44.3  --   --   --  43.4 40.6 43.9  PLT 211  --   --   --  217 181 210  LABPROT  --   --   --   --  13.0  --   --   INR  --   --   --   --  1.0  --   --   HEPARINUNFRC  --   --  0.36  --  0.34 0.37  --   CREATININE 0.74  --   --   --  0.77  --  0.69  TROPONINIHS 36* 182*  --  584*  --   --   --      Estimated Creatinine Clearance: 52.7 mL/min (by C-G formula based on SCr of 0.69 mg/dL).   Medical History: History reviewed. No pertinent past medical history.  Assessment: 21 YOF presenting with CP and elevated troponin with concern for ACS. No anticoagulation PTA. Pharmacy consulted to dose heparin.   Heparin level remained therapeutic this AM on Heparin infusion at 1000 units/hr.   CBC stable.  Left heart cath done this AM 5/30,  found 90% stenosis RCA,  proximal LAD 75-90% stenosis and 80% midsegment,  CIR with 95% stenosis ostial to proximal, and  distal 80% stenosis.  EF 55-60%.  TCTS consult pending,  Pharmacy consulted to  restart 2hr post radial TR band removal.  Sheat removed and radial TR band placed 0815  Goal of Therapy:  Heparin level 0.3-0.7 units/ml Monitor platelets by anticoagulation protocol: Yes   Plan:  F/u for time of radial TR band removal . Restart Heparin IV 1000 units/hr to start 2 hr post TR band removal Check 6-8 hr Heparin level  Daily CBC and heparin level  F/u TCTS consult  pending.   Thank you for allowing pharmacy to participate in this patient's care.  Noah Delaine, RPh Clinical Pharmacist 02/20/2022 9:25 AM Please check AMION for all [facility] Pharmacy phone numbers After 10:00 PM, call Main Pharmacy [phone]

## 2022-02-20 NOTE — Plan of Care (Signed)
  Problem: Education: Goal: Knowledge of General Education information will improve Description Including pain rating scale, medication(s)/side effects and non-pharmacologic comfort measures Outcome: Progressing   Problem: Clinical Measurements: Goal: Respiratory complications will improve Outcome: Progressing   Problem: Activity: Goal: Risk for activity intolerance will decrease Outcome: Progressing   Problem: Nutrition: Goal: Adequate nutrition will be maintained Outcome: Progressing   Problem: Coping: Goal: Level of anxiety will decrease Outcome: Progressing   Problem: Pain Managment: Goal: General experience of comfort will improve Outcome: Progressing   Problem: Safety: Goal: Ability to remain free from injury will improve Outcome: Progressing   

## 2022-02-20 NOTE — CV Procedure (Signed)
Severe calcified three-vessel coronary disease.  Right dominant anatomy Segmental 80% RCA mid disease. Short left main. Calcified 85% proximal LAD stenosis followed by poststenotic dilatation, and diffuse 85% mid LAD.  First diagonal arises proximal to high-grade stenosis in the mid LAD. 95% proximal segmental OM1 and 80% distal circumflex before the large fourth obtuse marginal. Mid anterior wall akinesis, very focal.  EF 55%.  EDP normal (10 mmHg).   TCTS consultation for multivessel coronary bypass surgery with grafting of the PDA, first and fourth obtuse marginal, LAD, and diagonal #1.

## 2022-02-20 NOTE — Consult Note (Addendum)
[address]       [place] [address]             [phone]        Cathy Love [facility] Medical Record #[id number] Date of Birth: 1946-02-23  Referring: No ref. provider found Primary Care: Mila Palmer, MD Primary Cardiologist:None  Chief Complaint:    Chief Complaint  Patient presents with   Chest Pain    History of Present Illness:   The patient is a 76 year old female we are asked to see in cardiothoracic surgical consultation for consideration of coronary artery surgical revascularization.  The patient presented on 02/18/2022 to the emergency department after developing some jaw pain.  Subsequent to that she developed a tight feeling in her chest.  At that time it did not radiate.  There is no associated dyspnea, nausea, or diaphoresis.  She called EMS and was also told to take an aspirin and at time of arrival symptoms had resolved.  EKG showed subtle lateral T wave inversions in leads I, aVL and no acute ST elevation.  Peak high-sensitivity troponin, and was 584.  She has no significant history of cardiac disease.  She also has no significant past medical history.  Cardiology consultation was obtained and she was admitted for further diagnostic evaluation and medical stabilization.  She is found to have severe three-vessel coronary artery disease with preserved left ventricular ejection fraction.  There is mid anterior wall focal hypokinesis.  LVEDP is in the normal range.  She has been started on aspirin, statin and beta-blocker.  Full report as described below.  Echocardiogram is also been performed and left ventricular ejection fraction by estimation is in the 60 to 65% range.  There are no significant valvular findings.  Full report is also as described below.  He is currently on heparin.    Current Activity/ Functional Status: Patient is independent with mobility/ambulation, transfers, ADL's, IADL's.   Zubrod Score: At the time of surgery  this patient's most appropriate activity status/level should be described as:     0    Normal activity, no symptoms     1    Restricted in physical strenuous activity but ambulatory, able to do out light work     2    Ambulatory and capable of self care, unable to do work activities, up and about                 more than 50%  Of the time                                3    Only limited self care, in bed greater than 50% of waking hours     4    Completely disabled, no self care, confined to bed or chair     5    Moribund  History reviewed. No pertinent past medical history.  PSH- Lap chole in 40's  Social History   Tobacco Use  Smoking Status Never  Smokeless Tobacco Never    + 1ppd smoker for approx 15 years- quit in 30's   Social History   Substance and Sexual Activity  Alcohol Use None     No Known Allergies  Current Facility-Administered Medications  Medication Dose Route Frequency Provider Last Rate Last Admin   0.9 %  sodium chloride infusion   Intravenous Continuous Lyn Records, MD  0.9 %  sodium chloride infusion   Intravenous Continuous Lyn Records, MD 125 mL/hr at 02/20/22 0930 New Bag at 02/20/22 0930   0.9 %  sodium chloride infusion  250 mL Intravenous PRN Lyn Records, MD       acetaminophen (TYLENOL) tablet 650 mg  650 mg Oral Q4H PRN Lyn Records, MD       aspirin chewable tablet 81 mg  81 mg Oral Daily Lyn Records, MD       atorvastatin (LIPITOR) tablet 80 mg  80 mg Oral Daily Lyn Records, MD   80 mg at 02/20/22 6761   hydrALAZINE (APRESOLINE) injection 10 mg  10 mg Intravenous Q20 Min PRN Lyn Records, MD       labetalol (NORMODYNE) injection 10 mg  10 mg Intravenous Q10 min PRN Lyn Records, MD       metoprolol tartrate (LOPRESSOR) tablet 12.5 mg  12.5 mg Oral BID Lyn Records, MD   12.5 mg at 02/20/22 0933   nitroGLYCERIN (NITROSTAT) SL tablet 0.4 mg  0.4 mg Sublingual Q5 Min x 3 PRN Lyn Records, MD        ondansetron [facility]) injection 4 mg  4 mg Intravenous Q6H PRN Lyn Records, MD       oxyCODONE (Oxy IR/ROXICODONE) immediate release tablet 5-10 mg  5-10 mg Oral Q4H PRN Lyn Records, MD       sodium chloride flush (NS) 0.9 % injection 3 mL  3 mL Intravenous Q12H Lyn Records, MD   3 mL at 02/20/22 0936   sodium chloride flush (NS) 0.9 % injection 3 mL  3 mL Intravenous Q12H Lyn Records, MD       sodium chloride flush (NS) 0.9 % injection 3 mL  3 mL Intravenous PRN Lyn Records, MD        Medications Prior to Admission  Medication Sig Dispense Refill Last Dose   ibuprofen (ADVIL) 200 MG tablet Take 400 mg by mouth every 6 (six) hours as needed for headache, fever or moderate pain.   02/18/2022   aspirin EC 81 MG tablet Take 324 mg by mouth once. Swallow whole.      COVID-19 mRNA bivalent vaccine, Moderna, (MODERNA COVID-19 BIVAL BOOSTER) 50 MCG/0.5ML injection Inject into the muscle. (Patient not taking: Reported on 02/18/2022) 0.5 mL 0 Completed Course   influenza vaccine adjuvanted (FLUAD QUADRIVALENT) 0.5 ML injection Inject into the muscle. (Patient not taking: Reported on 02/18/2022) 0.5 mL 0 Completed Course    History reviewed. No pertinent family history.   Review of Systems:   Review of Systems  Constitutional:  Positive for malaise/fatigue. Negative for chills, diaphoresis, fever and weight loss.  HENT:  Negative for congestion, ear discharge, ear pain, hearing loss, nosebleeds, sinus pain, sore throat and tinnitus.   Respiratory:  Negative for cough, hemoptysis, sputum production, shortness of breath, wheezing and stridor.   Cardiovascular:  Positive for chest pain. Negative for palpitations, orthopnea, claudication, leg swelling and PND.  Gastrointestinal:  Positive for heartburn. Negative for abdominal pain, blood in stool, constipation, diarrhea, melena, nausea and vomiting.  Genitourinary: Negative.   Musculoskeletal:  Positive for back pain and joint pain. Negative  for falls and neck pain.       Scoliosis  Skin:  Negative for itching and rash.  Neurological:  Negative for dizziness, tingling, tremors, sensory change, speech change, focal weakness, seizures, loss of consciousness, weakness and headaches.  Endo/Heme/Allergies:  Negative for environmental allergies and polydipsia. Bruises/bleeds easily.  Psychiatric/Behavioral:  Positive for memory loss. Negative for depression, hallucinations, substance abuse and suicidal ideas. The patient is not nervous/anxious and does not have insomnia.       Physical Exam: BP 136/84 (BP Location: Left Arm)   Pulse 64   Temp 97.9 F (36.6 C) (Oral)   Resp 18   Ht 4' 8.5" (1.435 m)   Wt 83.2 kg   SpO2 95%   BMI 40.39 kg/m    General appearance: alert, cooperative, and no distress Head: Normocephalic, without obvious abnormality, atraumatic Neck: no adenopathy, no carotid bruit, no JVD, supple, symmetrical, trachea midline, and thyroid not enlarged, symmetric, no tenderness/mass/nodules Lymph nodes: Cervical, supraclavicular, and axillary nodes normal. Resp: clear to auscultation bilaterally Back: non tender, + scoliosis Cardio: regular rate and rhythm, S1, S2 normal, no murmur, click, rub or gallop GI: soft, non-tender; bowel sounds normal; no masses,  no organomegaly Extremities: extremities normal, atraumatic, no cyanosis or edema, varicose veins noted, and + DP on right, absent PT, absent DP/PT on left Neurologic: Grossly normal  Diagnostic Studies & Laboratory data:     Recent Radiology Findings:   CARDIAC CATHETERIZATION  Result Date: 02/20/2022 CONCLUSIONS: Right dominant anatomy with proximal to mid 90% calcified stenosis in RCA. Short left main Proximal LAD 85 to 90% stenosis followed by post stenotic dilatation/aneurysm.  LAD contains diffuse disease in the midsegment up to 80% after the origin of the second diagonal. Circumflex is large gives origin to 4 obtuse marginal branches.  2 branches are  very small.  The first branch contains segmental 95% stenosis ostial to proximal.  The distal circumflex contains 80% stenosis before the origin of the large fourth obtuse marginal. Mid anterior wall focal hypokinesis.  Overall EF 55 to 60%.  EDP is normal. RECOMMENDATIONS: Resume IV heparin Aggressive risk factor modification T CTS consultation for CABG.  Will need to remain in hospital as she presented with non-ST elevation MI awakening with spontaneous jaw and chest discomfort which lasted approximately 2 hours.   ECHOCARDIOGRAM COMPLETE  Result Date: 02/18/2022    ECHOCARDIOGRAM REPORT   Patient Name:   Cathy Love Date of Exam: 02/18/2022 Medical Rec #:  [id number]        Height:       56.5 in Accession #:    [id number]       Weight:       183.6 lb Date of Birth:  [birth year]       BSA:          1.723 m Patient Age:    75 years         BP:           134/85 mmHg Patient Gender: F                HR:           60 bpm. Exam Location:  Inpatient Procedure: 2D Echo, Cardiac Doppler, Color Doppler and Strain Analysis Indications:    Chest pain                 MI  History:        Patient has no prior history of Echocardiogram examinations.  Sonographer:    Neomia Dear RDCS Referring Phys: [id number] LAURA R INGOLD  Sonographer Comments: Suboptimal parasternal window, suboptimal apical window and suboptimal subcostal window. IMPRESSIONS  1. Left ventricular ejection fraction, by estimation, is 60 to 65%. The left ventricle has normal  function. The left ventricle has no regional wall motion abnormalities. Left ventricular diastolic parameters were normal.  2. Right ventricular systolic function is normal. The right ventricular size is normal.  3. The mitral valve is normal in structure. No evidence of mitral valve regurgitation. No evidence of mitral stenosis.  4. The aortic valve is tricuspid. There is mild calcification of the aortic valve. There is mild thickening of the aortic valve. Aortic valve regurgitation is  not visualized. Aortic valve sclerosis is present, with no evidence of aortic valve stenosis.  5. The inferior vena cava is normal in size with greater than 50% respiratory variability, suggesting right atrial pressure of 3 mmHg. FINDINGS  Left Ventricle: Left ventricular ejection fraction, by estimation, is 60 to 65%. The left ventricle has normal function. The left ventricle has no regional wall motion abnormalities. The left ventricular internal cavity size was normal in size. There is  no left ventricular hypertrophy. Left ventricular diastolic parameters were normal. Right Ventricle: The right ventricular size is normal. No increase in right ventricular wall thickness. Right ventricular systolic function is normal. Left Atrium: Left atrial size was normal in size. Right Atrium: Right atrial size was normal in size. Pericardium: There is no evidence of pericardial effusion. Mitral Valve: The mitral valve is normal in structure. No evidence of mitral valve regurgitation. No evidence of mitral valve stenosis. Tricuspid Valve: The tricuspid valve is normal in structure. Tricuspid valve regurgitation is not demonstrated. No evidence of tricuspid stenosis. Aortic Valve: The aortic valve is tricuspid. There is mild calcification of the aortic valve. There is mild thickening of the aortic valve. Aortic valve regurgitation is not visualized. Aortic valve sclerosis is present, with no evidence of aortic valve stenosis. Aortic valve mean gradient measures 4.0 mmHg. Aortic valve peak gradient measures 7.8 mmHg. Aortic valve area, by VTI measures 1.73 cm. Pulmonic Valve: The pulmonic valve was normal in structure. Pulmonic valve regurgitation is not visualized. No evidence of pulmonic stenosis. Aorta: The aortic root is normal in size and structure. Venous: The inferior vena cava is normal in size with greater than 50% respiratory variability, suggesting right atrial pressure of 3 mmHg. IAS/Shunts: No atrial level shunt  detected by color flow Doppler.  LEFT VENTRICLE PLAX 2D LVIDd:         4.00 cm     Diastology LVIDs:         1.90 cm     LV e' medial:    7.43 cm/s LV PW:         1.30 cm     LV E/e' medial:  5.7 LV IVS:        1.20 cm     LV e' lateral:   6.83 cm/s LVOT diam:     2.10 cm     LV E/e' lateral: 6.3 LV SV:         52 LV SV Index:   30 LVOT Area:     3.46 cm  LV Volumes (MOD) LV vol d, MOD A2C: 41.3 ml LV vol d, MOD A4C: 36.6 ml LV vol s, MOD A2C: 22.7 ml LV vol s, MOD A4C: 19.9 ml LV SV MOD A2C:     18.6 ml LV SV MOD A4C:     36.6 ml LV SV MOD BP:      18.7 ml RIGHT VENTRICLE RV S prime:     9.60 cm/s TAPSE (M-mode): 1.6 cm LEFT ATRIUM  Index LA diam:        3.40 cm 1.97 cm/m LA Vol (A2C):   33.0 ml 19.15 ml/m LA Vol (A4C):   44.9 ml 26.05 ml/m LA Biplane Vol: 39.7 ml 23.04 ml/m  AORTIC VALVE                    PULMONIC VALVE AV Area (Vmax):    1.81 cm     PV Vmax:       0.77 m/s AV Area (Vmean):   1.71 cm     PV Vmean:      55.000 cm/s AV Area (VTI):     1.73 cm     PV VTI:        0.191 m AV Vmax:           140.00 cm/s  PV Peak grad:  2.4 mmHg AV Vmean:          94.300 cm/s  PV Mean grad:  1.0 mmHg AV VTI:            0.301 m AV Peak Grad:      7.8 mmHg AV Mean Grad:      4.0 mmHg LVOT Vmax:         73.20 cm/s LVOT Vmean:        46.600 cm/s LVOT VTI:          0.150 m LVOT/AV VTI ratio: 0.50  AORTA Ao Root diam: 2.40 cm Ao Asc diam:  2.70 cm MITRAL VALVE MV Area (PHT): 2.99 cm    SHUNTS MV Decel Time: 254 msec    Systemic VTI:  0.15 m MV E velocity: 42.70 cm/s  Systemic Diam: 2.10 cm MV A velocity: 70.30 cm/s MV E/A ratio:  0.61 Charlton HawsPeter Nishan MD Electronically signed by Charlton HawsPeter Nishan MD Signature Date/Time: 02/18/2022/3:49:03 PM    Final      I have independently reviewed the above radiologic studies and discussed with the patient   Recent Lab Findings: Lab Results  Component Value Date   WBC 6.0 02/20/2022   HGB 14.0 02/20/2022   HCT 43.9 02/20/2022   PLT 210 02/20/2022   GLUCOSE 100 (H)  02/20/2022   CHOL 220 (H) 02/19/2022   TRIG 172 (H) 02/19/2022   HDL 42 02/19/2022   LDLCALC 144 (H) 02/19/2022   ALT 13 02/18/2022   AST 19 02/18/2022   NA 137 02/20/2022   K 4.2 02/20/2022   CL 105 02/20/2022   CREATININE 0.69 02/20/2022   BUN 16 02/20/2022   CO2 26 02/20/2022   TSH 0.888 02/18/2022   INR 1.0 02/19/2022   HGBA1C 5.6 02/18/2022      Assessment / Plan: Non-STEMI with severe three-vessel coronary artery disease GERD Scoliosis Varicose veins Remote history of hemorrhoids during pregnancy Remote tobacco abuse   Plan: CABG, the surgeon will review the patient's studies and determine candidacy for the procedure.    I  spent 30 minutes counseling the patient face to face.   Rowe ClackWayne E Gold, PA-C  02/20/2022 10:39 AM  Patient examined, images of cardiac catheterization and echocardiogram and chest x-ray personally reviewed and counseled with patient.  The patient would benefit from bypass grafts to the LAD, circumflex marginal, posterior descending, and possibly the ramus intermediate branch of the left coronary.  We will plan on proceeding with surgery tomorrow.  I have discussed the benefits and risks of the operation with the patient and she agrees to proceed.  patient examined and medical record reviewed,agree  with above note. Lovett Sox 02/20/2022

## 2022-02-20 NOTE — Progress Notes (Signed)
DAILY PROGRESS NOTE   Patient Name: Cathy Love Date of Encounter: 02/20/2022 Cardiologist: None  Chief Complaint   No complaints  Patient Profile   76 yo female with chest pain, admitted for subendocardial MI (SEMI)  Subjective   Cathy Love had cath this morning - this demonstrated 3 vessel LAD with mild anterior wall hypokinesis and preserved LVEF to 55-60%. CT surgery has been consulted for CABG.  Objective   Vitals:   02/19/22 2359 02/20/22 0002 02/20/22 0452 02/20/22 0737  BP: 105/78  118/62   Pulse: 62  (!) 55   Resp: 18  18   Temp: 97.7 F (36.5 C)  97.7 F (36.5 C)   TempSrc: Oral  Oral   SpO2: 91%  91% 97%  Weight:  83.2 kg    Height:        Intake/Output Summary (Last 24 hours) at 02/20/2022 0856 Last data filed at 02/20/2022 0315 Gross per 24 hour  Intake 853.24 ml  Output 600 ml  Net 253.24 ml   Filed Weights   02/18/22 1213 02/19/22 0451 02/20/22 0002  Weight: 83.3 kg 83 kg 83.2 kg    Physical Exam   General appearance: alert and no distress Lungs: clear to auscultation bilaterally Heart: regular rate and rhythm, S1, S2 normal, no murmur, click, rub or gallop Extremities: extremities normal, atraumatic, no cyanosis or edema and right TR band in place Neurologic: Grossly normal  Inpatient Medications    Scheduled Meds:  aspirin  81 mg Oral Daily   aspirin EC  81 mg Oral Daily   atorvastatin  80 mg Oral Daily   atorvastatin  80 mg Oral Daily   metoprolol tartrate  12.5 mg Oral BID   sodium chloride flush  3 mL Intravenous Q12H   sodium chloride flush  3 mL Intravenous Q12H    Continuous Infusions:  sodium chloride     sodium chloride     sodium chloride      PRN Meds: sodium chloride, acetaminophen, acetaminophen, hydrALAZINE, labetalol, nitroGLYCERIN, ondansetron (ZOFRAN) IV, ondansetron (ZOFRAN) IV, oxyCODONE, sodium chloride flush   Labs   Results for orders placed or performed during the hospital encounter of 02/18/22  (from the past 48 hour(s))  Heparin level (unfractionated)     Status: None   Collection Time: 02/18/22  6:19 PM  Result Value Ref Range   Heparin Unfractionated 0.36 0.30 - 0.70 IU/mL    Comment: (NOTE) The clinical reportable range upper limit is being lowered to >1.10 to align with the FDA approved guidance for the current laboratory assay.  If heparin results are below expected values, and patient dosage has  been confirmed, suggest follow up testing of antithrombin III levels. Performed at [facility], [address], [place], [place] [address]   Magnesium     Status: None   Collection Time: 02/18/22  6:19 PM  Result Value Ref Range   Magnesium 2.1 1.7 - 2.4 mg/dL    Comment: Performed at [facility], [address] [place], [place] [address]  Hepatic function panel     Status: Abnormal   Collection Time: 02/18/22  6:19 PM  Result Value Ref Range   Total Protein 6.0 (L) 6.5 - 8.1 g/dL   Albumin 3.2 (L) 3.5 - 5.0 g/dL   AST 19 15 - 41 U/L   ALT 13 0 - 44 U/L   Alkaline Phosphatase 34 (L) 38 - 126 U/L   Total Bilirubin 0.5 0.3 - 1.2  mg/dL   Bilirubin, Direct <8.1 0.0 - 0.2 mg/dL   Indirect Bilirubin NOT CALCULATED 0.3 - 0.9 mg/dL    Comment: Performed at [facility], [address] [place], [place] [address]  TSH     Status: None   Collection Time: 02/18/22  6:19 PM  Result Value Ref Range   TSH 0.888 0.350 - 4.500 uIU/mL    Comment: Performed by a 3rd Generation assay with a functional sensitivity of <=0.01 uIU/mL. Performed at [facility], [address] [place], [place] [address]   Hemoglobin A1c     Status: None   Collection Time: 02/18/22  6:19 PM  Result Value Ref Range   Hgb A1c MFr Bld 5.6 4.8 - 5.6 %    Comment: (NOTE) Pre diabetes:          5.7%-6.4%  Diabetes:              >6.4%  Glycemic control for   <7.0% adults with diabetes    Mean Plasma Glucose 114.02 mg/dL    Comment: Performed at [facility], [address] [place], [place] [address]  Troponin I (High Sensitivity)     Status: Abnormal   Collection Time: 02/18/22  9:04 PM  Result Value Ref Range   Troponin I (High Sensitivity) 584 (HH) <18 ng/L    Comment: CRITICAL VALUE NOTED.  VALUE IS CONSISTENT WITH PREVIOUSLY REPORTED AND CALLED VALUE. (NOTE) Elevated high sensitivity troponin I (hsTnI) values and significant  changes across serial measurements may suggest ACS but many other  chronic and acute conditions are known to elevate hsTnI results.  Refer to the Links section for chest pain algorithms and additional  guidance. Performed at [facility], [address] [place], [place] [address]   Heparin level (unfractionated)     Status: None   Collection Time: 02/19/22  1:50 AM  Result Value Ref Range   Heparin Unfractionated 0.34 0.30 - 0.70 IU/mL    Comment: (NOTE) The clinical reportable range upper limit is being lowered to >1.10 to align with the FDA approved guidance for the current laboratory assay.  If heparin results are below expected values, and patient dosage has  been confirmed, suggest follow up testing of antithrombin III levels. Performed at [facility], [address], [place], [place] [address]   CBC     Status: None   Collection Time: 02/19/22  1:50 AM  Result Value Ref Range   WBC 6.8 4.0 - 10.5 K/uL   RBC 4.78 3.87 - 5.11 MIL/uL   Hemoglobin 14.2 12.0 - 15.0 g/dL   HCT 52.8 41.3 - 24.4 %   MCV 90.8 80.0 - 100.0 fL   MCH 29.7 26.0 - 34.0 pg   MCHC 32.7 30.0 - 36.0 g/dL   RDW 01.0 27.2 - 53.6 %   Platelets 217 150 - 400 K/uL   nRBC 0.0 0.0 - 0.2 %    Comment: Performed at [facility], [address] [place], [place] [address]  Basic metabolic panel     Status: Abnormal   Collection Time: 02/19/22  1:50 AM  Result Value Ref Range   Sodium 141 135 - 145 mmol/L   Potassium 4.2 3.5 - 5.1 mmol/L   Chloride 109 98 - 111 mmol/L   CO2 26 22 - 32 mmol/L   Glucose, Bld 103 (H) 70 - 99  mg/dL    Comment: Glucose reference range applies only  to samples taken after fasting for at least 8 hours.   BUN 11 8 - 23 mg/dL   Creatinine, Ser 1.61 0.44 - 1.00 mg/dL   Calcium 9.1 8.9 - 09.6 mg/dL   GFR, Estimated >04 >54 mL/min    Comment: (NOTE) Calculated using the CKD-EPI Creatinine Equation (2021)    Anion gap 6 5 - 15    Comment: Performed at [facility], [address] [place], [place] [address]  Lipid panel     Status: Abnormal   Collection Time: 02/19/22  1:50 AM  Result Value Ref Range   Cholesterol 220 (H) 0 - 200 mg/dL   Triglycerides 914 (H) <150 mg/dL   HDL 42 >78 mg/dL   Total CHOL/HDL Ratio 5.2 RATIO   VLDL 34 0 - 40 mg/dL   LDL Cholesterol 295 (H) 0 - 99 mg/dL    Comment:        Total Cholesterol/HDL:CHD Risk Coronary Heart Disease Risk Table                     Men   Women  1/2 Average Risk   3.4   3.3  Average Risk       5.0   4.4  2 X Average Risk   9.6   7.1  3 X Average Risk  23.4   11.0        Use the calculated Patient Ratio above and the CHD Risk Table to determine the patient's CHD Risk.        ATP III CLASSIFICATION (LDL):  <100     mg/dL   Optimal  621-308  mg/dL   Near or Above                    Optimal  130-159  mg/dL   Borderline  657-846  mg/dL   High  >962     mg/dL   Very High Performed at [facility], [address], [place], [place] [address]   Protime-INR     Status: None   Collection Time: 02/19/22  1:50 AM  Result Value Ref Range   Prothrombin Time 13.0 11.4 - 15.2 seconds   INR 1.0 0.8 - 1.2    Comment: (NOTE) INR goal varies based on device and disease states. Performed at [facility], [address], [place], [place] [address]   Heparin level (unfractionated)     Status: None   Collection Time: 02/20/22  3:42 AM  Result Value Ref Range   Heparin Unfractionated 0.37 0.30 - 0.70 IU/mL    Comment: (NOTE) The clinical reportable range upper limit is being lowered to >1.10 to align with the FDA  approved guidance for the current laboratory assay.  If heparin results are below expected values, and patient dosage has  been confirmed, suggest follow up testing of antithrombin III levels. Performed at [facility], [address], [place], [place] [address]   CBC     Status: None   Collection Time: 02/20/22  3:42 AM  Result Value Ref Range   WBC 5.8 4.0 - 10.5 K/uL   RBC 4.44 3.87 - 5.11 MIL/uL   Hemoglobin 13.2 12.0 - 15.0 g/dL   HCT 25.3 66.4 - 40.3 %   MCV 91.4 80.0 - 100.0 fL   MCH 29.7 26.0 - 34.0 pg   MCHC 32.5 30.0 - 36.0 g/dL   RDW 47.4 25.9 - 56.3 %   Platelets  181 150 - 400 K/uL   nRBC 0.0 0.0 - 0.2 %    Comment: Performed at [facility], [address], [place], [place] [address]  Basic metabolic panel     Status: Abnormal   Collection Time: 02/20/22  6:26 AM  Result Value Ref Range   Sodium 137 135 - 145 mmol/L   Potassium 4.2 3.5 - 5.1 mmol/L   Chloride 105 98 - 111 mmol/L   CO2 26 22 - 32 mmol/L   Glucose, Bld 100 (H) 70 - 99 mg/dL    Comment: Glucose reference range applies only to samples taken after fasting for at least 8 hours.   BUN 16 8 - 23 mg/dL   Creatinine, Ser 4.62 0.44 - 1.00 mg/dL   Calcium 8.7 (L) 8.9 - 10.3 mg/dL   GFR, Estimated >70 >35 mL/min    Comment: (NOTE) Calculated using the CKD-EPI Creatinine Equation (2021)    Anion gap 6 5 - 15    Comment: Performed at [facility], [address], [place], [place] [address]  CBC     Status: None   Collection Time: 02/20/22  6:26 AM  Result Value Ref Range   WBC 6.0 4.0 - 10.5 K/uL   RBC 4.76 3.87 - 5.11 MIL/uL   Hemoglobin 14.0 12.0 - 15.0 g/dL   HCT 18.2 99.3 - 71.6 %   MCV 92.2 80.0 - 100.0 fL   MCH 29.4 26.0 - 34.0 pg   MCHC 31.9 30.0 - 36.0 g/dL   RDW 96.7 89.3 - 81.0 %   Platelets 210 150 - 400 K/uL   nRBC 0.0 0.0 - 0.2 %    Comment: Performed at [facility], [address] [place], [place] [address]    ECG   N/A  Telemetry   Sinus rhythm -  Personally Reviewed  Radiology    CARDIAC CATHETERIZATION  Result Date: 02/20/2022 CONCLUSIONS: Right dominant anatomy with proximal to mid 90% calcified stenosis in RCA. Short left main Proximal LAD 85 to 90% stenosis followed by post stenotic dilatation/aneurysm.  LAD contains diffuse disease in the midsegment up to 80% after the origin of the second diagonal. Circumflex is large gives origin to 4 obtuse marginal branches.  2 branches are very small.  The first branch contains segmental 95% stenosis ostial to proximal.  The distal circumflex contains 80% stenosis before the origin of the large fourth obtuse marginal. Mid anterior wall focal hypokinesis.  Overall EF 55 to 60%.  EDP is normal. RECOMMENDATIONS: Resume IV heparin Aggressive risk factor modification T CTS consultation for CABG.  Will need to remain in hospital as she presented with non-ST elevation MI awakening with spontaneous jaw and chest discomfort which lasted approximately 2 hours.   ECHOCARDIOGRAM COMPLETE  Result Date: 02/18/2022    ECHOCARDIOGRAM REPORT   Patient Name:   Cathy Love Date of Exam: 02/18/2022 Medical Rec #:  [id number]        Height:       56.5 in Accession #:    [id number]       Weight:       183.6 lb Date of Birth:  09-29-1945       BSA:          1.723 m Patient Age:    75 years         BP:           134/85 mmHg Patient Gender: F  HR:           60 bpm. Exam Location:  Inpatient Procedure: 2D Echo, Cardiac Doppler, Color Doppler and Strain Analysis Indications:    Chest pain                 MI  History:        Patient has no prior history of Echocardiogram examinations.  Sonographer:    Neomia Dear RDCS Referring Phys: [id number] LAURA R INGOLD  Sonographer Comments: Suboptimal parasternal window, suboptimal apical window and suboptimal subcostal window. IMPRESSIONS  1. Left ventricular ejection fraction, by estimation, is 60 to 65%. The left ventricle has normal function. The left ventricle has no  regional wall motion abnormalities. Left ventricular diastolic parameters were normal.  2. Right ventricular systolic function is normal. The right ventricular size is normal.  3. The mitral valve is normal in structure. No evidence of mitral valve regurgitation. No evidence of mitral stenosis.  4. The aortic valve is tricuspid. There is mild calcification of the aortic valve. There is mild thickening of the aortic valve. Aortic valve regurgitation is not visualized. Aortic valve sclerosis is present, with no evidence of aortic valve stenosis.  5. The inferior vena cava is normal in size with greater than 50% respiratory variability, suggesting right atrial pressure of 3 mmHg. FINDINGS  Left Ventricle: Left ventricular ejection fraction, by estimation, is 60 to 65%. The left ventricle has normal function. The left ventricle has no regional wall motion abnormalities. The left ventricular internal cavity size was normal in size. There is  no left ventricular hypertrophy. Left ventricular diastolic parameters were normal. Right Ventricle: The right ventricular size is normal. No increase in right ventricular wall thickness. Right ventricular systolic function is normal. Left Atrium: Left atrial size was normal in size. Right Atrium: Right atrial size was normal in size. Pericardium: There is no evidence of pericardial effusion. Mitral Valve: The mitral valve is normal in structure. No evidence of mitral valve regurgitation. No evidence of mitral valve stenosis. Tricuspid Valve: The tricuspid valve is normal in structure. Tricuspid valve regurgitation is not demonstrated. No evidence of tricuspid stenosis. Aortic Valve: The aortic valve is tricuspid. There is mild calcification of the aortic valve. There is mild thickening of the aortic valve. Aortic valve regurgitation is not visualized. Aortic valve sclerosis is present, with no evidence of aortic valve stenosis. Aortic valve mean gradient measures 4.0 mmHg. Aortic  valve peak gradient measures 7.8 mmHg. Aortic valve area, by VTI measures 1.73 cm. Pulmonic Valve: The pulmonic valve was normal in structure. Pulmonic valve regurgitation is not visualized. No evidence of pulmonic stenosis. Aorta: The aortic root is normal in size and structure. Venous: The inferior vena cava is normal in size with greater than 50% respiratory variability, suggesting right atrial pressure of 3 mmHg. IAS/Shunts: No atrial level shunt detected by color flow Doppler.  LEFT VENTRICLE PLAX 2D LVIDd:         4.00 cm     Diastology LVIDs:         1.90 cm     LV e' medial:    7.43 cm/s LV PW:         1.30 cm     LV E/e' medial:  5.7 LV IVS:        1.20 cm     LV e' lateral:   6.83 cm/s LVOT diam:     2.10 cm     LV E/e' lateral: 6.3 LV SV:  52 LV SV Index:   30 LVOT Area:     3.46 cm  LV Volumes (MOD) LV vol d, MOD A2C: 41.3 ml LV vol d, MOD A4C: 36.6 ml LV vol s, MOD A2C: 22.7 ml LV vol s, MOD A4C: 19.9 ml LV SV MOD A2C:     18.6 ml LV SV MOD A4C:     36.6 ml LV SV MOD BP:      18.7 ml RIGHT VENTRICLE RV S prime:     9.60 cm/s TAPSE (M-mode): 1.6 cm LEFT ATRIUM             Index LA diam:        3.40 cm 1.97 cm/m LA Vol (A2C):   33.0 ml 19.15 ml/m LA Vol (A4C):   44.9 ml 26.05 ml/m LA Biplane Vol: 39.7 ml 23.04 ml/m  AORTIC VALVE                    PULMONIC VALVE AV Area (Vmax):    1.81 cm     PV Vmax:       0.77 m/s AV Area (Vmean):   1.71 cm     PV Vmean:      55.000 cm/s AV Area (VTI):     1.73 cm     PV VTI:        0.191 m AV Vmax:           140.00 cm/s  PV Peak grad:  2.4 mmHg AV Vmean:          94.300 cm/s  PV Mean grad:  1.0 mmHg AV VTI:            0.301 m AV Peak Grad:      7.8 mmHg AV Mean Grad:      4.0 mmHg LVOT Vmax:         73.20 cm/s LVOT Vmean:        46.600 cm/s LVOT VTI:          0.150 m LVOT/AV VTI ratio: 0.50  AORTA Ao Root diam: 2.40 cm Ao Asc diam:  2.70 cm MITRAL VALVE MV Area (PHT): 2.99 cm    SHUNTS MV Decel Time: 254 msec    Systemic VTI:  0.15 m MV E velocity:  42.70 cm/s  Systemic Diam: 2.10 cm MV A velocity: 70.30 cm/s MV E/A ratio:  0.61 Charlton HawsPeter Nishan MD Electronically signed by Charlton HawsPeter Nishan MD Signature Date/Time: 02/18/2022/3:49:03 PM    Final     Cardiac Studies   See above  Assessment   Principal Problem:   SEMI (subendocardial myocardial infarction) Cathy Love(HCC) Active Problems:   Unstable angina (HCC)   Chest discomfort   Elevated troponin   Plan   Cathy Love was 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 She is doing well post-cath - hungry. On ASA, statin and BB.   Time Spent Directly with Patient:  I have spent a total of 15 minutes with the patient reviewing hospital notes, telemetry, EKGs, labs and examining the patient as well as establishing an assessment and plan that was discussed personally with the patient.  > 50% of time was spent in direct patient care.  Length of Stay:  LOS: 2 days   Chrystie NoseKenneth C. Torian Thoennes, MD, Doctors' Community HospitalFACC, FACP  [facility]  Medical Director of the [facility]  [facility] Reduction [facility] Diplomate of the American Board of Clinical  Lipidology Attending Cardiologist  Direct Dial: [phone]  Fax: [phone]  Website:  [email or web address] 02/20/2022, 8:56 AM

## 2022-02-20 NOTE — Interval H&P Note (Signed)
Cath Lab Visit (complete for each Cath Lab visit)  Clinical Evaluation Leading to the Procedure:   ACS: Yes.    Non-ACS:    Anginal Classification: CCS III  Anti-ischemic medical therapy: Minimal Therapy (1 class of medications)  Non-Invasive Test Results: No non-invasive testing performed  Prior CABG: No previous CABG      History and Physical Interval Note:  02/20/2022 7:36 AM  Cathy Love  has presented today for surgery, with the diagnosis of NSTEMI.  The various methods of treatment have been discussed with the patient and family. After consideration of risks, benefits and other options for treatment, the patient has consented to  Procedure(s): LEFT HEART CATH AND CORONARY ANGIOGRAPHY (N/A) as a surgical intervention.  The patient's history has been reviewed, patient examined, no change in status, stable for surgery.  I have reviewed the patient's chart and labs.  Questions were answered to the patient's satisfaction.     Lyn Records III

## 2022-02-21 ENCOUNTER — Inpatient Hospital Stay (HOSPITAL_COMMUNITY): Payer: PPO

## 2022-02-21 ENCOUNTER — Inpatient Hospital Stay (HOSPITAL_COMMUNITY): Payer: PPO | Admitting: Certified Registered Nurse Anesthetist

## 2022-02-21 ENCOUNTER — Encounter (HOSPITAL_COMMUNITY)
Admission: EM | Disposition: A | Discharge status: Home Health | Payer: Self-pay | Source: Home / Self Care | Attending: Cardiothoracic Surgery

## 2022-02-21 ENCOUNTER — Encounter (HOSPITAL_COMMUNITY): Payer: PPO

## 2022-02-21 ENCOUNTER — Other Ambulatory Visit (HOSPITAL_COMMUNITY): Payer: PPO

## 2022-02-21 DIAGNOSIS — I252 Old myocardial infarction: Secondary | ICD-10-CM | POA: Diagnosis not present

## 2022-02-21 DIAGNOSIS — I25119 Atherosclerotic heart disease of native coronary artery with unspecified angina pectoris: Secondary | ICD-10-CM

## 2022-02-21 DIAGNOSIS — I2510 Atherosclerotic heart disease of native coronary artery without angina pectoris: Secondary | ICD-10-CM | POA: Diagnosis not present

## 2022-02-21 HISTORY — PX: TEE WITHOUT CARDIOVERSION: SHX5443

## 2022-02-21 HISTORY — PX: CORONARY ARTERY BYPASS GRAFT: SHX141

## 2022-02-21 LAB — POCT I-STAT 7, (LYTES, BLD GAS, ICA,H+H)
Acid-Base Excess: 3 mmol/L — ABNORMAL HIGH (ref 0.0–2.0)
Acid-Base Excess: 3 mmol/L — ABNORMAL HIGH (ref 0.0–2.0)
Acid-Base Excess: 2 mmol/L (ref 0.0–2.0)
Acid-Base Excess: 0 mmol/L (ref 0.0–2.0)
Acid-Base Excess: 4 mmol/L — ABNORMAL HIGH (ref 0.0–2.0)
Acid-Base Excess: 1 mmol/L (ref 0.0–2.0)
Acid-Base Excess: 1 mmol/L (ref 0.0–2.0)
Acid-base deficit: 1 mmol/L (ref 0.0–2.0)
Acid-base deficit: 1 mmol/L (ref 0.0–2.0)
Acid-base deficit: 2 mmol/L (ref 0.0–2.0)
Bicarbonate: 23.7 mmol/L (ref 20.0–28.0)
Bicarbonate: 26.5 mmol/L (ref 20.0–28.0)
Bicarbonate: 27.7 mmol/L (ref 20.0–28.0)
Bicarbonate: 25.7 mmol/L (ref 20.0–28.0)
Bicarbonate: 24.3 mmol/L (ref 20.0–28.0)
Bicarbonate: 29.3 mmol/L — ABNORMAL HIGH (ref 20.0–28.0)
Bicarbonate: 27.9 mmol/L (ref 20.0–28.0)
Bicarbonate: 25.5 mmol/L (ref 20.0–28.0)
Bicarbonate: 24.5 mmol/L (ref 20.0–28.0)
Bicarbonate: 24 mmol/L (ref 20.0–28.0)
Calcium, Ion: 0.92 mmol/L — ABNORMAL LOW (ref 1.15–1.40)
Calcium, Ion: 0.94 mmol/L — ABNORMAL LOW (ref 1.15–1.40)
Calcium, Ion: 0.99 mmol/L — ABNORMAL LOW (ref 1.15–1.40)
Calcium, Ion: 0.92 mmol/L — ABNORMAL LOW (ref 1.15–1.40)
Calcium, Ion: 0.83 mmol/L — CL (ref 1.15–1.40)
Calcium, Ion: 0.99 mmol/L — ABNORMAL LOW (ref 1.15–1.40)
Calcium, Ion: 1.13 mmol/L — ABNORMAL LOW (ref 1.15–1.40)
Calcium, Ion: 1.1 mmol/L — ABNORMAL LOW (ref 1.15–1.40)
Calcium, Ion: 1.09 mmol/L — ABNORMAL LOW (ref 1.15–1.40)
Calcium, Ion: 1.12 mmol/L — ABNORMAL LOW (ref 1.15–1.40)
HCT: 26 % — ABNORMAL LOW (ref 36.0–46.0)
HCT: 26 % — ABNORMAL LOW (ref 36.0–46.0)
HCT: 28 % — ABNORMAL LOW (ref 36.0–46.0)
HCT: 28 % — ABNORMAL LOW (ref 36.0–46.0)
HCT: 25 % — ABNORMAL LOW (ref 36.0–46.0)
HCT: 33 % — ABNORMAL LOW (ref 36.0–46.0)
HCT: 24 % — ABNORMAL LOW (ref 36.0–46.0)
HCT: 27 % — ABNORMAL LOW (ref 36.0–46.0)
HCT: 24 % — ABNORMAL LOW (ref 36.0–46.0)
HCT: 23 % — ABNORMAL LOW (ref 36.0–46.0)
Hemoglobin: 8.8 g/dL — ABNORMAL LOW (ref 12.0–15.0)
Hemoglobin: 8.8 g/dL — ABNORMAL LOW (ref 12.0–15.0)
Hemoglobin: 9.5 g/dL — ABNORMAL LOW (ref 12.0–15.0)
Hemoglobin: 9.5 g/dL — ABNORMAL LOW (ref 12.0–15.0)
Hemoglobin: 11.2 g/dL — ABNORMAL LOW (ref 12.0–15.0)
Hemoglobin: 8.5 g/dL — ABNORMAL LOW (ref 12.0–15.0)
Hemoglobin: 8.2 g/dL — ABNORMAL LOW (ref 12.0–15.0)
Hemoglobin: 9.2 g/dL — ABNORMAL LOW (ref 12.0–15.0)
Hemoglobin: 8.2 g/dL — ABNORMAL LOW (ref 12.0–15.0)
Hemoglobin: 7.8 g/dL — ABNORMAL LOW (ref 12.0–15.0)
O2 Saturation: 76 %
O2 Saturation: 100 %
O2 Saturation: 100 %
O2 Saturation: 100 %
O2 Saturation: 100 %
O2 Saturation: 100 %
O2 Saturation: 96 %
O2 Saturation: 99 %
O2 Saturation: 96 %
O2 Saturation: 93 %
Patient temperature: 35.5
Patient temperature: 36.6
Patient temperature: 36.7
Patient temperature: 36.8
Potassium: 4.5 mmol/L (ref 3.5–5.1)
Potassium: 4.1 mmol/L (ref 3.5–5.1)
Potassium: 4.1 mmol/L (ref 3.5–5.1)
Potassium: 4.4 mmol/L (ref 3.5–5.1)
Potassium: 3.7 mmol/L (ref 3.5–5.1)
Potassium: 4.1 mmol/L (ref 3.5–5.1)
Potassium: 3.3 mmol/L — ABNORMAL LOW (ref 3.5–5.1)
Potassium: 3.9 mmol/L (ref 3.5–5.1)
Potassium: 4.3 mmol/L (ref 3.5–5.1)
Potassium: 4 mmol/L (ref 3.5–5.1)
Sodium: 139 mmol/L (ref 135–145)
Sodium: 139 mmol/L (ref 135–145)
Sodium: 140 mmol/L (ref 135–145)
Sodium: 139 mmol/L (ref 135–145)
Sodium: 140 mmol/L (ref 135–145)
Sodium: 142 mmol/L (ref 135–145)
Sodium: 142 mmol/L (ref 135–145)
Sodium: 141 mmol/L (ref 135–145)
Sodium: 140 mmol/L (ref 135–145)
Sodium: 139 mmol/L (ref 135–145)
TCO2: 25 mmol/L (ref 22–32)
TCO2: 28 mmol/L (ref 22–32)
TCO2: 29 mmol/L (ref 22–32)
TCO2: 27 mmol/L (ref 22–32)
TCO2: 25 mmol/L (ref 22–32)
TCO2: 31 mmol/L (ref 22–32)
TCO2: 29 mmol/L (ref 22–32)
TCO2: 27 mmol/L (ref 22–32)
TCO2: 26 mmol/L (ref 22–32)
TCO2: 25 mmol/L (ref 22–32)
pCO2 arterial: 39.2 mmHg (ref 32–48)
pCO2 arterial: 37.2 mmHg (ref 32–48)
pCO2 arterial: 41.7 mmHg (ref 32–48)
pCO2 arterial: 34.2 mmHg (ref 32–48)
pCO2 arterial: 35.9 mmHg (ref 32–48)
pCO2 arterial: 45.8 mmHg (ref 32–48)
pCO2 arterial: 49.8 mmHg — ABNORMAL HIGH (ref 32–48)
pCO2 arterial: 41.1 mmHg (ref 32–48)
pCO2 arterial: 42 mmHg (ref 32–48)
pCO2 arterial: 44.8 mmHg (ref 32–48)
pH, Arterial: 7.389 (ref 7.35–7.45)
pH, Arterial: 7.431 (ref 7.35–7.45)
pH, Arterial: 7.46 — ABNORMAL HIGH (ref 7.35–7.45)
pH, Arterial: 7.483 — ABNORMAL HIGH (ref 7.35–7.45)
pH, Arterial: 7.414 (ref 7.35–7.45)
pH, Arterial: 7.437 (ref 7.35–7.45)
pH, Arterial: 7.349 — ABNORMAL LOW (ref 7.35–7.45)
pH, Arterial: 7.399 (ref 7.35–7.45)
pH, Arterial: 7.373 (ref 7.35–7.45)
pH, Arterial: 7.336 — ABNORMAL LOW (ref 7.35–7.45)
pO2, Arterial: 41 mmHg — ABNORMAL LOW (ref 83–108)
pO2, Arterial: 445 mmHg — ABNORMAL HIGH (ref 83–108)
pO2, Arterial: 468 mmHg — ABNORMAL HIGH (ref 83–108)
pO2, Arterial: 406 mmHg — ABNORMAL HIGH (ref 83–108)
pO2, Arterial: 196 mmHg — ABNORMAL HIGH (ref 83–108)
pO2, Arterial: 299 mmHg — ABNORMAL HIGH (ref 83–108)
pO2, Arterial: 80 mmHg — ABNORMAL LOW (ref 83–108)
pO2, Arterial: 121 mmHg — ABNORMAL HIGH (ref 83–108)
pO2, Arterial: 82 mmHg — ABNORMAL LOW (ref 83–108)
pO2, Arterial: 71 mmHg — ABNORMAL LOW (ref 83–108)

## 2022-02-21 LAB — ECHO INTRAOPERATIVE TEE
AR max vel: 1.38 cm2
AV Area VTI: 1.25 cm2
AV Area mean vel: 1.53 cm2
AV Mean grad: 8 mmHg
AV Peak grad: 17 mmHg
Ao pk vel: 2.06 m/s
Height: 56.5 in
MV M vel: 5.07 m/s
MV Peak grad: 102.8 mmHg
MV VTI: 2.64 cm2
MV Vena cont: 0.5 cm
Weight: 2920.65 oz

## 2022-02-21 LAB — BASIC METABOLIC PANEL
Anion gap: 6 (ref 5–15)
BUN: 14 mg/dL (ref 8–23)
CO2: 27 mmol/L (ref 22–32)
Calcium: 9 mg/dL (ref 8.9–10.3)
Chloride: 108 mmol/L (ref 98–111)
Creatinine, Ser: 0.68 mg/dL (ref 0.44–1.00)
GFR, Estimated: >60 mL/min (ref >60)
Glucose, Bld: 96 mg/dL (ref 70–99)
Potassium: 4.2 mmol/L (ref 3.5–5.1)
Sodium: 141 mmol/L (ref 135–145)

## 2022-02-21 LAB — POCT I-STAT, CHEM 8
BUN: 14 mg/dL (ref 8–23)
BUN: 12 mg/dL (ref 8–23)
BUN: 10 mg/dL (ref 8–23)
BUN: 11 mg/dL (ref 8–23)
BUN: 11 mg/dL (ref 8–23)
Calcium, Ion: 1.25 mmol/L (ref 1.15–1.40)
Calcium, Ion: 1.2 mmol/L (ref 1.15–1.40)
Calcium, Ion: 0.92 mmol/L — ABNORMAL LOW (ref 1.15–1.40)
Calcium, Ion: 0.95 mmol/L — ABNORMAL LOW (ref 1.15–1.40)
Calcium, Ion: 0.82 mmol/L — CL (ref 1.15–1.40)
Chloride: 103 mmol/L (ref 98–111)
Chloride: 103 mmol/L (ref 98–111)
Chloride: 102 mmol/L (ref 98–111)
Chloride: 97 mmol/L — ABNORMAL LOW (ref 98–111)
Chloride: 102 mmol/L (ref 98–111)
Creatinine, Ser: 0.4 mg/dL — ABNORMAL LOW (ref 0.44–1.00)
Creatinine, Ser: 0.5 mg/dL (ref 0.44–1.00)
Creatinine, Ser: 0.3 mg/dL — ABNORMAL LOW (ref 0.44–1.00)
Creatinine, Ser: 0.3 mg/dL — ABNORMAL LOW (ref 0.44–1.00)
Creatinine, Ser: 0.3 mg/dL — ABNORMAL LOW (ref 0.44–1.00)
Glucose, Bld: 102 mg/dL — ABNORMAL HIGH (ref 70–99)
Glucose, Bld: 102 mg/dL — ABNORMAL HIGH (ref 70–99)
Glucose, Bld: 140 mg/dL — ABNORMAL HIGH (ref 70–99)
Glucose, Bld: 165 mg/dL — ABNORMAL HIGH (ref 70–99)
Glucose, Bld: 156 mg/dL — ABNORMAL HIGH (ref 70–99)
HCT: 34 % — ABNORMAL LOW (ref 36.0–46.0)
HCT: 34 % — ABNORMAL LOW (ref 36.0–46.0)
HCT: 23 % — ABNORMAL LOW (ref 36.0–46.0)
HCT: 22 % — ABNORMAL LOW (ref 36.0–46.0)
HCT: 24 % — ABNORMAL LOW (ref 36.0–46.0)
Hemoglobin: 11.6 g/dL — ABNORMAL LOW (ref 12.0–15.0)
Hemoglobin: 11.6 g/dL — ABNORMAL LOW (ref 12.0–15.0)
Hemoglobin: 7.8 g/dL — ABNORMAL LOW (ref 12.0–15.0)
Hemoglobin: 7.5 g/dL — ABNORMAL LOW (ref 12.0–15.0)
Hemoglobin: 8.2 g/dL — ABNORMAL LOW (ref 12.0–15.0)
Potassium: 4.1 mmol/L (ref 3.5–5.1)
Potassium: 4.1 mmol/L (ref 3.5–5.1)
Potassium: 4.4 mmol/L (ref 3.5–5.1)
Potassium: 4.4 mmol/L (ref 3.5–5.1)
Potassium: 3.6 mmol/L (ref 3.5–5.1)
Sodium: 141 mmol/L (ref 135–145)
Sodium: 139 mmol/L (ref 135–145)
Sodium: 139 mmol/L (ref 135–145)
Sodium: 140 mmol/L (ref 135–145)
Sodium: 140 mmol/L (ref 135–145)
TCO2: 29 mmol/L (ref 22–32)
TCO2: 30 mmol/L (ref 22–32)
TCO2: 27 mmol/L (ref 22–32)
TCO2: 30 mmol/L (ref 22–32)
TCO2: 25 mmol/L (ref 22–32)

## 2022-02-21 LAB — CBC
HCT: 41.4 % (ref 36.0–46.0)
HCT: 26.1 % — ABNORMAL LOW (ref 36.0–46.0)
HCT: 23.9 % — ABNORMAL LOW (ref 36.0–46.0)
Hemoglobin: 13.5 g/dL (ref 12.0–15.0)
Hemoglobin: 8.5 g/dL — ABNORMAL LOW (ref 12.0–15.0)
Hemoglobin: 7.8 g/dL — ABNORMAL LOW (ref 12.0–15.0)
MCH: 29.7 pg (ref 26.0–34.0)
MCH: 30 pg (ref 26.0–34.0)
MCH: 29.9 pg (ref 26.0–34.0)
MCHC: 32.6 g/dL (ref 30.0–36.0)
MCHC: 32.6 g/dL (ref 30.0–36.0)
MCHC: 32.6 g/dL (ref 30.0–36.0)
MCV: 91 fL (ref 80.0–100.0)
MCV: 92.2 fL (ref 80.0–100.0)
MCV: 91.6 fL (ref 80.0–100.0)
Platelets: 181 10*3/uL (ref 150–400)
Platelets: 137 K/uL — ABNORMAL LOW (ref 150–400)
Platelets: 147 10*3/uL — ABNORMAL LOW (ref 150–400)
RBC: 4.55 MIL/uL (ref 3.87–5.11)
RBC: 2.83 MIL/uL — ABNORMAL LOW (ref 3.87–5.11)
RBC: 2.61 MIL/uL — ABNORMAL LOW (ref 3.87–5.11)
RDW: 13.6 % (ref 11.5–15.5)
RDW: 13.6 % (ref 11.5–15.5)
RDW: 13.6 % (ref 11.5–15.5)
WBC: 6 10*3/uL (ref 4.0–10.5)
WBC: 14 K/uL — ABNORMAL HIGH (ref 4.0–10.5)
WBC: 12.1 10*3/uL — ABNORMAL HIGH (ref 4.0–10.5)
nRBC: 0 % (ref 0.0–0.2)
nRBC: 0 % (ref 0.0–0.2)
nRBC: 0 % (ref 0.0–0.2)

## 2022-02-21 LAB — MAGNESIUM: Magnesium: 2.6 mg/dL — ABNORMAL HIGH (ref 1.7–2.4)

## 2022-02-21 LAB — HEMOGLOBIN AND HEMATOCRIT, BLOOD
HCT: 22.9 % — ABNORMAL LOW (ref 36.0–46.0)
Hemoglobin: 7.7 g/dL — ABNORMAL LOW (ref 12.0–15.0)

## 2022-02-21 LAB — HEPARIN LEVEL (UNFRACTIONATED)
Heparin Unfractionated: 0.25 IU/mL — ABNORMAL LOW (ref 0.30–0.70)
Heparin Unfractionated: 0.32 IU/mL (ref 0.30–0.70)

## 2022-02-21 LAB — GLUCOSE, CAPILLARY
Glucose-Capillary: 143 mg/dL — ABNORMAL HIGH (ref 70–99)
Glucose-Capillary: 152 mg/dL — ABNORMAL HIGH (ref 70–99)
Glucose-Capillary: 146 mg/dL — ABNORMAL HIGH (ref 70–99)
Glucose-Capillary: 124 mg/dL — ABNORMAL HIGH (ref 70–99)
Glucose-Capillary: 87 mg/dL (ref 70–99)
Glucose-Capillary: 136 mg/dL — ABNORMAL HIGH (ref 70–99)
Glucose-Capillary: 169 mg/dL — ABNORMAL HIGH (ref 70–99)
Glucose-Capillary: 164 mg/dL — ABNORMAL HIGH (ref 70–99)
Glucose-Capillary: 159 mg/dL — ABNORMAL HIGH (ref 70–99)

## 2022-02-21 LAB — PROTIME-INR
INR: 1.3 — ABNORMAL HIGH (ref 0.8–1.2)
Prothrombin Time: 16.4 seconds — ABNORMAL HIGH (ref 11.4–15.2)

## 2022-02-21 LAB — LIPOPROTEIN A (LPA): Lipoprotein (a): 104.5 nmol/L — ABNORMAL HIGH (ref <75.0)

## 2022-02-21 LAB — HEMOGLOBIN A1C
Hgb A1c MFr Bld: 5.6 % (ref 4.8–5.6)
Mean Plasma Glucose: 114.02 mg/dL

## 2022-02-21 LAB — CREATININE, SERUM
Creatinine, Ser: 0.62 mg/dL (ref 0.44–1.00)
GFR, Estimated: >60 mL/min (ref >60)

## 2022-02-21 LAB — APTT: aPTT: 30 s (ref 24–36)

## 2022-02-21 LAB — PLATELET COUNT: Platelets: 141 10*3/uL — ABNORMAL LOW (ref 150–400)

## 2022-02-21 LAB — PREPARE RBC (CROSSMATCH)

## 2022-02-21 SURGERY — CORONARY ARTERY BYPASS GRAFTING (CABG)
Anesthesia: General | Site: Chest

## 2022-02-21 MED ORDER — CHLORHEXIDINE GLUCONATE 0.12% ORAL RINSE (MEDLINE KIT)
15.0000 mL | Freq: Two times a day (BID) | OROMUCOSAL | Status: DC
  Administered 2022-02-22 – 2022-02-24 (×4): 15 mL via OROMUCOSAL

## 2022-02-21 MED ORDER — MIDAZOLAM HCL (PF) 10 MG/2ML IJ SOLN
INTRAMUSCULAR | Status: AC
  Filled 2022-02-21: qty 2

## 2022-02-21 MED ORDER — NITROGLYCERIN IN D5W 200-5 MCG/ML-% IV SOLN: 0.0000 ug/min | INTRAVENOUS | Status: DC

## 2022-02-21 MED ORDER — VANCOMYCIN HCL IN DEXTROSE 1-5 GM/200ML-% IV SOLN
1000.0000 mg | Freq: Once | INTRAVENOUS | Status: AC
  Administered 2022-02-21: 1000 mg via INTRAVENOUS
  Filled 2022-02-21: qty 200

## 2022-02-21 MED ORDER — ASPIRIN 81 MG PO CHEW: 324.0000 mg | CHEWABLE_TABLET | Freq: Every day | ORAL | Status: DC

## 2022-02-21 MED ORDER — 0.9 % SODIUM CHLORIDE (POUR BTL) OPTIME
TOPICAL | Status: DC | PRN
  Administered 2022-02-21: 5000 mL

## 2022-02-21 MED ORDER — ACETAMINOPHEN 160 MG/5ML PO SOLN
650.0000 mg | Freq: Once | ORAL | Status: AC
  Administered 2022-02-21: 650 mg

## 2022-02-21 MED ORDER — FENTANYL CITRATE (PF) 250 MCG/5ML IJ SOLN
INTRAMUSCULAR | Status: AC
  Filled 2022-02-21: qty 5

## 2022-02-21 MED ORDER — MILRINONE LACTATE IN DEXTROSE 20-5 MG/100ML-% IV SOLN
0.2500 ug/kg/min | INTRAVENOUS | Status: DC
  Administered 2022-02-21: 0.25 ug/kg/min via INTRAVENOUS
  Filled 2022-02-21: qty 100

## 2022-02-21 MED ORDER — MAGNESIUM SULFATE 4 GM/100ML IV SOLN
4.0000 g | Freq: Once | INTRAVENOUS | Status: AC
  Administered 2022-02-21: 4 g via INTRAVENOUS

## 2022-02-21 MED ORDER — CALCIUM CHLORIDE 10 % IV SOLN
INTRAVENOUS | Status: DC | PRN
  Administered 2022-02-21: 100 mg via INTRAVENOUS
  Administered 2022-02-21 (×2): 200 mg via INTRAVENOUS

## 2022-02-21 MED ORDER — SODIUM CHLORIDE 0.9 % IV SOLN: INTRAVENOUS | Status: DC | PRN

## 2022-02-21 MED ORDER — BISACODYL 10 MG RE SUPP
10.0000 mg | Freq: Every day | RECTAL | Status: DC
Start: 2022-02-22 — End: 2022-02-24

## 2022-02-21 MED ORDER — ONDANSETRON HCL 4 MG/2ML IJ SOLN
4.0000 mg | Freq: Four times a day (QID) | INTRAMUSCULAR | Status: DC | PRN
Start: 2022-02-21 — End: 2022-02-26
  Administered 2022-02-21 – 2022-02-23 (×2): 4 mg via INTRAVENOUS
  Filled 2022-02-21 (×2): qty 2

## 2022-02-21 MED ORDER — ACETAMINOPHEN 500 MG PO TABS
1000.0000 mg | ORAL_TABLET | Freq: Four times a day (QID) | ORAL | Status: DC
  Administered 2022-02-21 – 2022-02-26 (×15): 1000 mg via ORAL
  Filled 2022-02-21 (×17): qty 2

## 2022-02-21 MED ORDER — BISACODYL 5 MG PO TBEC
10.0000 mg | DELAYED_RELEASE_TABLET | Freq: Every day | ORAL | Status: DC
  Administered 2022-02-22 – 2022-02-23 (×2): 10 mg via ORAL
  Filled 2022-02-21 (×3): qty 2

## 2022-02-21 MED ORDER — HEMOSTATIC AGENTS (NO CHARGE) OPTIME
TOPICAL | Status: DC | PRN
  Administered 2022-02-21: 1 via TOPICAL

## 2022-02-21 MED ORDER — CHLORHEXIDINE GLUCONATE CLOTH 2 % EX PADS
6.0000 | MEDICATED_PAD | Freq: Every day | CUTANEOUS | Status: DC
  Administered 2022-02-21 – 2022-02-24 (×4): 6 via TOPICAL

## 2022-02-21 MED ORDER — PHENYLEPHRINE 80 MCG/ML (10ML) SYRINGE FOR IV PUSH (FOR BLOOD PRESSURE SUPPORT)
PREFILLED_SYRINGE | INTRAVENOUS | Status: AC
  Filled 2022-02-21: qty 10

## 2022-02-21 MED ORDER — MIDAZOLAM HCL 2 MG/2ML IJ SOLN: 2.0000 mg | INTRAMUSCULAR | Status: DC | PRN

## 2022-02-21 MED ORDER — HEPARIN SODIUM (PORCINE) 1000 UNIT/ML IJ SOLN
INTRAMUSCULAR | Status: AC
  Filled 2022-02-21: qty 1

## 2022-02-21 MED ORDER — ASPIRIN 325 MG PO TBEC
325.0000 mg | DELAYED_RELEASE_TABLET | Freq: Every day | ORAL | Status: DC
  Administered 2022-02-22 – 2022-02-24 (×3): 325 mg via ORAL
  Filled 2022-02-21 (×3): qty 1

## 2022-02-21 MED ORDER — CALCIUM CHLORIDE 10 % IV SOLN
INTRAVENOUS | Status: AC
  Filled 2022-02-21: qty 20

## 2022-02-21 MED ORDER — SODIUM CHLORIDE 0.9% FLUSH
3.0000 mL | Freq: Two times a day (BID) | INTRAVENOUS | Status: DC
  Administered 2022-02-22 – 2022-02-24 (×4): 3 mL via INTRAVENOUS

## 2022-02-21 MED ORDER — SODIUM CHLORIDE 0.45 % IV SOLN: INTRAVENOUS | Status: DC | PRN

## 2022-02-21 MED ORDER — PLASMA-LYTE A IV SOLN: INTRAVENOUS | Status: DC | PRN

## 2022-02-21 MED ORDER — PROPOFOL 10 MG/ML IV BOLUS
INTRAVENOUS | Status: DC | PRN
  Administered 2022-02-21: 60 mg via INTRAVENOUS

## 2022-02-21 MED ORDER — LACTATED RINGERS IV SOLN: INTRAVENOUS | Status: DC

## 2022-02-21 MED ORDER — LACTATED RINGERS IV SOLN
500.0000 mL | Freq: Once | INTRAVENOUS | Status: DC | PRN
Start: 2022-02-21 — End: 2022-02-24

## 2022-02-21 MED ORDER — CHLORHEXIDINE GLUCONATE 0.12 % MT SOLN
OROMUCOSAL | Status: AC
  Administered 2022-02-21: 15 mL via OROMUCOSAL
  Filled 2022-02-21: qty 15

## 2022-02-21 MED ORDER — SODIUM CHLORIDE (PF) 0.9 % IJ SOLN
OROMUCOSAL | Status: DC | PRN
  Administered 2022-02-21: 12 mL via TOPICAL

## 2022-02-21 MED ORDER — SODIUM CHLORIDE 0.9% FLUSH: 3.0000 mL | INTRAVENOUS | Status: DC | PRN

## 2022-02-21 MED ORDER — OXYCODONE HCL 5 MG PO TABS
5.0000 mg | ORAL_TABLET | ORAL | Status: DC | PRN
  Administered 2022-02-24 – 2022-02-25 (×2): 10 mg via ORAL
  Filled 2022-02-21 (×2): qty 2

## 2022-02-21 MED ORDER — CHLORHEXIDINE GLUCONATE 0.12 % MT SOLN
15.0000 mL | OROMUCOSAL | Status: AC
  Administered 2022-02-21: 15 mL via OROMUCOSAL

## 2022-02-21 MED ORDER — LACTATED RINGERS IV SOLN
INTRAVENOUS | Status: DC
Start: 2022-02-21 — End: 2022-02-24

## 2022-02-21 MED ORDER — POTASSIUM CHLORIDE 10 MEQ/50ML IV SOLN
10.0000 meq | INTRAVENOUS | Status: AC
  Administered 2022-02-21 (×3): 10 meq via INTRAVENOUS

## 2022-02-21 MED ORDER — PANTOPRAZOLE SODIUM 40 MG PO TBEC
40.0000 mg | DELAYED_RELEASE_TABLET | Freq: Every day | ORAL | Status: DC
  Administered 2022-02-23 – 2022-02-26 (×4): 40 mg via ORAL
  Filled 2022-02-21 (×4): qty 1

## 2022-02-21 MED ORDER — METOPROLOL TARTRATE 5 MG/5ML IV SOLN: 2.5000 mg | INTRAVENOUS | Status: DC | PRN

## 2022-02-21 MED ORDER — METOPROLOL TARTRATE 25 MG/10 ML ORAL SUSPENSION
12.5000 mg | Freq: Two times a day (BID) | ORAL | Status: DC
  Filled 2022-02-21: qty 5

## 2022-02-21 MED ORDER — TRAMADOL HCL 50 MG PO TABS
50.0000 mg | ORAL_TABLET | ORAL | Status: DC | PRN
  Administered 2022-02-22: 50 mg via ORAL
  Filled 2022-02-21: qty 1

## 2022-02-21 MED ORDER — METOCLOPRAMIDE HCL 5 MG/ML IJ SOLN
10.0000 mg | Freq: Four times a day (QID) | INTRAMUSCULAR | Status: DC
  Administered 2022-02-21 – 2022-02-23 (×9): 10 mg via INTRAVENOUS
  Filled 2022-02-21 (×8): qty 2

## 2022-02-21 MED ORDER — ROCURONIUM BROMIDE 10 MG/ML (PF) SYRINGE
PREFILLED_SYRINGE | INTRAVENOUS | Status: DC | PRN
  Administered 2022-02-21: 50 mg via INTRAVENOUS
  Administered 2022-02-21 (×2): 100 mg via INTRAVENOUS

## 2022-02-21 MED ORDER — FENTANYL CITRATE (PF) 250 MCG/5ML IJ SOLN
INTRAMUSCULAR | Status: DC | PRN
  Administered 2022-02-21: 100 ug via INTRAVENOUS
  Administered 2022-02-21: 150 ug via INTRAVENOUS
  Administered 2022-02-21: 200 ug via INTRAVENOUS
  Administered 2022-02-21 (×2): 100 ug via INTRAVENOUS

## 2022-02-21 MED ORDER — SODIUM CHLORIDE 0.9 % IV SOLN: INTRAVENOUS | Status: DC

## 2022-02-21 MED ORDER — ROCURONIUM BROMIDE 10 MG/ML (PF) SYRINGE
PREFILLED_SYRINGE | INTRAVENOUS | Status: AC
  Filled 2022-02-21: qty 10

## 2022-02-21 MED ORDER — DEXMEDETOMIDINE HCL IN NACL 400 MCG/100ML IV SOLN
0.0000 ug/kg/h | INTRAVENOUS | Status: DC
  Administered 2022-02-21: 0.1 ug/kg/h via INTRAVENOUS
  Administered 2022-02-22: 0.3 ug/kg/h via INTRAVENOUS

## 2022-02-21 MED ORDER — ALBUMIN HUMAN 5 % IV SOLN
250.0000 mL | INTRAVENOUS | Status: AC | PRN
  Administered 2022-02-21 (×2): 12.5 g via INTRAVENOUS

## 2022-02-21 MED ORDER — SODIUM CHLORIDE 0.9 % IV SOLN
20.0000 ug | Freq: Once | INTRAVENOUS | Status: AC
  Administered 2022-02-21: 20 ug via INTRAVENOUS
  Filled 2022-02-21: qty 5

## 2022-02-21 MED ORDER — LACTATED RINGERS IV SOLN: INTRAVENOUS | Status: DC | PRN

## 2022-02-21 MED ORDER — DOCUSATE SODIUM 100 MG PO CAPS
200.0000 mg | ORAL_CAPSULE | Freq: Every day | ORAL | Status: DC
  Administered 2022-02-22 – 2022-02-23 (×2): 200 mg via ORAL
  Filled 2022-02-21 (×3): qty 2

## 2022-02-21 MED ORDER — PHENYLEPHRINE HCL-NACL 20-0.9 MG/250ML-% IV SOLN
0.0000 ug/min | INTRAVENOUS | Status: DC
  Administered 2022-02-21: 15 ug/min via INTRAVENOUS
  Filled 2022-02-21: qty 250

## 2022-02-21 MED ORDER — HEMOSTATIC AGENTS (NO CHARGE) OPTIME
TOPICAL | Status: DC | PRN
  Administered 2022-02-21 (×2): 1 via TOPICAL

## 2022-02-21 MED ORDER — PROPOFOL 10 MG/ML IV BOLUS
INTRAVENOUS | Status: AC
  Filled 2022-02-21: qty 20

## 2022-02-21 MED ORDER — ORAL CARE MOUTH RINSE
15.0000 mL | OROMUCOSAL | Status: DC
  Administered 2022-02-21 – 2022-02-22 (×4): 15 mL via OROMUCOSAL

## 2022-02-21 MED ORDER — ROCURONIUM BROMIDE 10 MG/ML (PF) SYRINGE
PREFILLED_SYRINGE | INTRAVENOUS | Status: AC
  Filled 2022-02-21: qty 20

## 2022-02-21 MED ORDER — SURGIFLO WITH THROMBIN (HEMOSTATIC MATRIX KIT) OPTIME
TOPICAL | Status: DC | PRN
  Administered 2022-02-21 (×2): 1 via TOPICAL

## 2022-02-21 MED ORDER — HEPARIN SODIUM (PORCINE) 1000 UNIT/ML IJ SOLN
INTRAMUSCULAR | Status: DC | PRN
  Administered 2022-02-21: 26000 [IU] via INTRAVENOUS
  Administered 2022-02-21: 3000 [IU] via INTRAVENOUS

## 2022-02-21 MED ORDER — PROTAMINE SULFATE 10 MG/ML IV SOLN
INTRAVENOUS | Status: AC
  Filled 2022-02-21: qty 5

## 2022-02-21 MED ORDER — MIDAZOLAM HCL (PF) 5 MG/ML IJ SOLN
INTRAMUSCULAR | Status: DC | PRN
  Administered 2022-02-21: 2 mg via INTRAVENOUS
  Administered 2022-02-21: 3 mg via INTRAVENOUS
  Administered 2022-02-21: 2 mg via INTRAVENOUS
  Administered 2022-02-21: 1 mg via INTRAVENOUS
  Administered 2022-02-21: 2 mg via INTRAVENOUS

## 2022-02-21 MED ORDER — MORPHINE SULFATE (PF) 2 MG/ML IV SOLN
1.0000 mg | INTRAVENOUS | Status: DC | PRN
  Administered 2022-02-21: 2 mg via INTRAVENOUS
  Filled 2022-02-21: qty 1

## 2022-02-21 MED ORDER — SODIUM CHLORIDE 0.9 % IV SOLN: 250.0000 mL | INTRAVENOUS | Status: DC

## 2022-02-21 MED ORDER — PHENYLEPHRINE 80 MCG/ML (10ML) SYRINGE FOR IV PUSH (FOR BLOOD PRESSURE SUPPORT)
PREFILLED_SYRINGE | INTRAVENOUS | Status: DC | PRN
  Administered 2022-02-21: 40 ug via INTRAVENOUS
  Administered 2022-02-21: 120 ug via INTRAVENOUS
  Administered 2022-02-21 (×2): 80 ug via INTRAVENOUS

## 2022-02-21 MED ORDER — CEFAZOLIN SODIUM-DEXTROSE 2-4 GM/100ML-% IV SOLN
2.0000 g | Freq: Three times a day (TID) | INTRAVENOUS | Status: AC
  Administered 2022-02-21 – 2022-02-23 (×6): 2 g via INTRAVENOUS
  Filled 2022-02-21 (×5): qty 100

## 2022-02-21 MED ORDER — DEXTROSE 50 % IV SOLN: 0.0000 mL | INTRAVENOUS | Status: DC | PRN

## 2022-02-21 MED ORDER — ACETAMINOPHEN 650 MG RE SUPP: 650.0000 mg | Freq: Once | RECTAL | Status: AC

## 2022-02-21 MED ORDER — PROTAMINE SULFATE 10 MG/ML IV SOLN
INTRAVENOUS | Status: AC
  Filled 2022-02-21: qty 25

## 2022-02-21 MED ORDER — INSULIN REGULAR(HUMAN) IN NACL 100-0.9 UT/100ML-% IV SOLN: INTRAVENOUS | Status: DC

## 2022-02-21 MED ORDER — ACETAMINOPHEN 160 MG/5ML PO SOLN: 1000.0000 mg | Freq: Four times a day (QID) | ORAL | Status: DC

## 2022-02-21 MED ORDER — PROTAMINE SULFATE 10 MG/ML IV SOLN
INTRAVENOUS | Status: DC | PRN
  Administered 2022-02-21: 270 mg via INTRAVENOUS
  Administered 2022-02-21: 20 mg via INTRAVENOUS

## 2022-02-21 MED ORDER — METOPROLOL TARTRATE 12.5 MG HALF TABLET
12.5000 mg | ORAL_TABLET | Freq: Two times a day (BID) | ORAL | Status: DC
  Administered 2022-02-22 – 2022-02-24 (×5): 12.5 mg via ORAL
  Filled 2022-02-21 (×5): qty 1

## 2022-02-21 MED ORDER — FAMOTIDINE IN NACL 20-0.9 MG/50ML-% IV SOLN
20.0000 mg | Freq: Two times a day (BID) | INTRAVENOUS | Status: AC
  Administered 2022-02-21 (×2): 20 mg via INTRAVENOUS
  Filled 2022-02-21: qty 50

## 2022-02-21 MED ORDER — NOREPINEPHRINE 4 MG/250ML-% IV SOLN
0.0000 ug/min | INTRAVENOUS | Status: DC
  Administered 2022-02-22: 5 ug/min via INTRAVENOUS
  Filled 2022-02-21: qty 250

## 2022-02-21 MED FILL — Lidocaine HCl Local Preservative Free (PF) Inj 1%: INTRAMUSCULAR | Qty: 30 | Status: AC

## 2022-02-21 SURGICAL SUPPLY — 101 items
ADAPTER CARDIO PERF ANTE/RETRO (ADAPTER) ×2 IMPLANT
BAG DECANTER FOR FLEXI CONT (MISCELLANEOUS) ×2 IMPLANT
BINDER BREAST XLRG (GAUZE/BANDAGES/DRESSINGS) ×1 IMPLANT
BLADE CLIPPER SURG (BLADE) ×2 IMPLANT
BLADE NDL 3 SS STRL (BLADE) IMPLANT
BLADE NEEDLE 3 SS STRL (BLADE) ×2 IMPLANT
BLADE STERNUM SYSTEM 6 (BLADE) ×2 IMPLANT
BLADE SURG 12 STRL SS (BLADE) ×2 IMPLANT
BNDG ELASTIC 4X5.8 VLCR STR LF (GAUZE/BANDAGES/DRESSINGS) ×3 IMPLANT
BNDG ELASTIC 6X5.8 VLCR STR LF (GAUZE/BANDAGES/DRESSINGS) ×3 IMPLANT
BNDG GAUZE ELAST 4 BULKY (GAUZE/BANDAGES/DRESSINGS) ×3 IMPLANT
CANISTER SUCT 3000ML PPV (MISCELLANEOUS) ×2 IMPLANT
CANNULA GUNDRY RCSP 15FR (MISCELLANEOUS) ×2 IMPLANT
CATH CPB KIT VANTRIGT (MISCELLANEOUS) ×2 IMPLANT
CATH ROBINSON RED A/P 18FR (CATHETERS) ×6 IMPLANT
CATH THORACIC 28FR RT ANG (CATHETERS) ×2 IMPLANT
CLIP TI WIDE RED SMALL 24 (CLIP) ×1 IMPLANT
CONTAINER PROTECT SURGISLUSH (MISCELLANEOUS) ×4 IMPLANT
DERMABOND ADVANCED (GAUZE/BANDAGES/DRESSINGS) ×2
DERMABOND ADVANCED .7 DNX12 (GAUZE/BANDAGES/DRESSINGS) IMPLANT
DRAIN CHANNEL 32F RND 10.7 FF (WOUND CARE) ×2 IMPLANT
DRAPE CARDIOVASCULAR INCISE (DRAPES) ×1
DRAPE SLUSH/WARMER DISC (DRAPES) ×2 IMPLANT
DRAPE SRG 135X102X78XABS (DRAPES) ×1 IMPLANT
DRSG AQUACEL AG ADV 3.5X14 (GAUZE/BANDAGES/DRESSINGS) ×2 IMPLANT
ELECT BLADE 4.0 EZ CLEAN MEGAD (MISCELLANEOUS) ×2
ELECT BLADE 6.5 EXT (BLADE) ×2 IMPLANT
ELECT CAUTERY BLADE 6.4 (BLADE) ×2 IMPLANT
ELECT REM PT RETURN 9FT ADLT (ELECTROSURGICAL) ×4
ELECTRODE BLDE 4.0 EZ CLN MEGD (MISCELLANEOUS) ×1 IMPLANT
ELECTRODE REM PT RTRN 9FT ADLT (ELECTROSURGICAL) ×2 IMPLANT
FELT TEFLON 1X6 (MISCELLANEOUS) ×4 IMPLANT
GAUZE 4X4 16PLY ~~LOC~~+RFID DBL (SPONGE) ×2 IMPLANT
GAUZE SPONGE 4X4 12PLY STRL (GAUZE/BANDAGES/DRESSINGS) ×4 IMPLANT
GAUZE SPONGE 4X4 12PLY STRL LF (GAUZE/BANDAGES/DRESSINGS) ×3 IMPLANT
GLOVE BIO SURGEON STRL SZ7.5 (GLOVE) ×6 IMPLANT
GOWN STRL REUS W/ TWL LRG LVL3 (GOWN DISPOSABLE) ×4 IMPLANT
GOWN STRL REUS W/TWL LRG LVL3 (GOWN DISPOSABLE) ×4
HEMOSTAT POWDER SURGIFOAM 1G (HEMOSTASIS) ×6 IMPLANT
HEMOSTAT SURGICEL 2X14 (HEMOSTASIS) ×2 IMPLANT
INSERT FOGARTY XLG (MISCELLANEOUS) IMPLANT
KIT BASIN OR (CUSTOM PROCEDURE TRAY) ×2 IMPLANT
KIT SUCTION CATH 14FR (SUCTIONS) ×2 IMPLANT
KIT TURNOVER KIT B (KITS) ×2 IMPLANT
KIT VASOVIEW HEMOPRO 2 VH 4000 (KITS) ×2 IMPLANT
LEAD PACING MYOCARDI (MISCELLANEOUS) ×2 IMPLANT
MARKER GRAFT CORONARY BYPASS (MISCELLANEOUS) ×5 IMPLANT
NS IRRIG 1000ML POUR BTL (IV SOLUTION) ×10 IMPLANT
PACK E OPEN HEART (SUTURE) ×2 IMPLANT
PACK OPEN HEART (CUSTOM PROCEDURE TRAY) ×2 IMPLANT
PAD ARMBOARD 7.5X6 YLW CONV (MISCELLANEOUS) ×4 IMPLANT
PAD ELECT DEFIB RADIOL ZOLL (MISCELLANEOUS) ×2 IMPLANT
PENCIL BUTTON HOLSTER BLD 10FT (ELECTRODE) ×2 IMPLANT
POSITIONER HEAD DONUT 9IN (MISCELLANEOUS) ×2 IMPLANT
POWDER SURGICEL 3.0 GRAM (HEMOSTASIS) ×2 IMPLANT
PUNCH AORTIC ROTATE 4.0MM (MISCELLANEOUS) IMPLANT
PUNCH AORTIC ROTATE 4.5MM 8IN (MISCELLANEOUS) ×1 IMPLANT
PUNCH AORTIC ROTATE 5MM 8IN (MISCELLANEOUS) IMPLANT
SET MPS 3-ND DEL (MISCELLANEOUS) ×1 IMPLANT
SPONGE T-LAP 18X18 ~~LOC~~+RFID (SPONGE) ×8 IMPLANT
SPONGE T-LAP 4X18 ~~LOC~~+RFID (SPONGE) ×3 IMPLANT
SUPPORT HEART JANKE-BARRON (MISCELLANEOUS) ×2 IMPLANT
SURGIFLO W/THROMBIN 8M KIT (HEMOSTASIS) ×2 IMPLANT
SUT BONE WAX W31G (SUTURE) ×2 IMPLANT
SUT ETHILON 3 0 PS 1 (SUTURE) ×1 IMPLANT
SUT MNCRL AB 4-0 PS2 18 (SUTURE) ×2 IMPLANT
SUT PROLENE 3 0 SH DA (SUTURE) IMPLANT
SUT PROLENE 3 0 SH1 36 (SUTURE) IMPLANT
SUT PROLENE 4 0 RB 1 (SUTURE) ×2
SUT PROLENE 4 0 SH DA (SUTURE) ×2 IMPLANT
SUT PROLENE 4-0 RB1 .5 CRCL 36 (SUTURE) ×1 IMPLANT
SUT PROLENE 5 0 C 1 36 (SUTURE) ×2 IMPLANT
SUT PROLENE 6 0 C 1 30 (SUTURE) IMPLANT
SUT PROLENE 6 0 CC (SUTURE) ×6 IMPLANT
SUT PROLENE 8 0 BV175 6 (SUTURE) IMPLANT
SUT PROLENE BLUE 7 0 (SUTURE) ×2 IMPLANT
SUT SILK  1 MH (SUTURE) ×1
SUT SILK 1 MH (SUTURE) IMPLANT
SUT SILK 2 0 SH CR/8 (SUTURE) ×1 IMPLANT
SUT SILK 3 0 SH CR/8 (SUTURE) IMPLANT
SUT STEEL 6MS V (SUTURE) ×4 IMPLANT
SUT STEEL SZ 6 DBL 3X14 BALL (SUTURE) ×2 IMPLANT
SUT VIC AB 1 CTX 27 (SUTURE) ×2 IMPLANT
SUT VIC AB 1 CTX 36 (SUTURE) ×2
SUT VIC AB 1 CTX36XBRD ANBCTR (SUTURE) ×2 IMPLANT
SUT VIC AB 2-0 CT1 27 (SUTURE) ×1
SUT VIC AB 2-0 CT1 TAPERPNT 27 (SUTURE) IMPLANT
SUT VIC AB 2-0 CTX 27 (SUTURE) IMPLANT
SUT VIC AB 3-0 SH 27 (SUTURE) ×1
SUT VIC AB 3-0 SH 27X BRD (SUTURE) IMPLANT
SUT VIC AB 3-0 X1 27 (SUTURE) IMPLANT
SYSTEM SAHARA CHEST DRAIN ATS (WOUND CARE) ×2 IMPLANT
TOWEL GREEN STERILE (TOWEL DISPOSABLE) ×2 IMPLANT
TOWEL GREEN STERILE FF (TOWEL DISPOSABLE) ×2 IMPLANT
TRAY CATH LUMEN 1 20CM STRL (SET/KITS/TRAYS/PACK) ×2 IMPLANT
TRAY FOLEY SLVR 16FR TEMP STAT (SET/KITS/TRAYS/PACK) ×2 IMPLANT
TUBE SUCT INTRACARD DLP 20F (MISCELLANEOUS) ×1 IMPLANT
TUBING ART PRESS 48 MALE/FEM (TUBING) ×2 IMPLANT
TUBING LAP HI FLOW INSUFFLATIO (TUBING) ×2 IMPLANT
UNDERPAD 30X36 HEAVY ABSORB (UNDERPADS AND DIAPERS) ×2 IMPLANT
WATER STERILE IRR 1000ML POUR (IV SOLUTION) ×4 IMPLANT

## 2022-02-21 NOTE — Anesthesia Procedure Notes (Signed)
Arterial Line Insertion Performed by: Shantoria Ellwood B, CRNA, CRNA  Patient location: Pre-op. Preanesthetic checklist: patient identified, IV checked, site marked, risks and benefits discussed, surgical consent, monitors and equipment checked, pre-op evaluation, timeout performed and anesthesia consent Lidocaine 1% used for infiltration Right, radial was placed Catheter size: 20 G Hand hygiene performed , maximum sterile barriers used  and Seldinger technique used Allen's test indicative of satisfactory collateral circulation Attempts: 1 Procedure performed without using ultrasound guided technique. Following insertion, dressing applied and Biopatch. Post procedure assessment: normal  Patient tolerated the procedure well with no immediate complications.    

## 2022-02-21 NOTE — Anesthesia Procedure Notes (Signed)
Procedure Name: Intubation Date/Time: 02/21/2022 7:57 AM Performed by: Valda Favia, CRNA Pre-anesthesia Checklist: Patient identified, Emergency Drugs available, Suction available and Patient being monitored Patient Re-evaluated:Patient Re-evaluated prior to induction Oxygen Delivery Method: Circle System Utilized Preoxygenation: Pre-oxygenation with 100% oxygen Induction Type: IV induction Ventilation: Mask ventilation without difficulty and Oral airway inserted - appropriate to patient size Laryngoscope Size: Mac and 4 Grade View: Grade I Tube type: Oral Tube size: 7.5 mm Number of attempts: 1 Airway Equipment and Method: Stylet and Oral airway Placement Confirmation: ETT inserted through vocal cords under direct vision, positive ETCO2 and breath sounds checked- equal and bilateral Secured at: 22 cm Tube secured with: Tape Dental Injury: Teeth and Oropharynx as per pre-operative assessment

## 2022-02-21 NOTE — Progress Notes (Signed)
Patient ID: Cathy Love, female   DOB: October 15, 1945, 76 y.o.   MRN: [id number]  TCTS Evening Rounds:   Hemodynamically stable  CI = 2.1  Has started to wake up on vent.   Urine output good  CT output low  CBC    Component Value Date/Time   WBC 14.0 (H) 02/21/2022 1438   RBC 2.83 (L) 02/21/2022 1438   HGB 8.5 (L) 02/21/2022 1438   HCT 26.1 (L) 02/21/2022 1438   PLT 137 (L) 02/21/2022 1438   MCV 92.2 02/21/2022 1438   MCH 30.0 02/21/2022 1438   MCHC 32.6 02/21/2022 1438   RDW 13.6 02/21/2022 1438   LYMPHSABS 1.4 02/18/2022 0450   MONOABS 0.5 02/18/2022 0450   EOSABS 0.1 02/18/2022 0450   BASOSABS 0.1 02/18/2022 0450     BMET    Component Value Date/Time   NA 142 02/21/2022 1313   K 3.7 02/21/2022 1313   CL 102 02/21/2022 1305   CO2 27 02/21/2022 0345   GLUCOSE 156 (H) 02/21/2022 1305   BUN 11 02/21/2022 1305   CREATININE 0.30 (L) 02/21/2022 1305   CALCIUM 9.0 02/21/2022 0345   GFRNONAA >60 02/21/2022 0345     A/P:  Stable postop course. Continue current plans

## 2022-02-21 NOTE — Anesthesia Procedure Notes (Signed)
Central Venous Catheter Insertion Performed by: Oleta Mouse, MD, anesthesiologist Start/End5/31/2023 7:18 AM, 02/21/2022 7:28 AM Patient location: Pre-op. Preanesthetic checklist: patient identified, IV checked, site marked, risks and benefits discussed, surgical consent, monitors and equipment checked, pre-op evaluation, timeout performed and anesthesia consent Lidocaine 1% used for infiltration and patient sedated Hand hygiene performed  and maximum sterile barriers used  Catheter size: 9 Fr Total catheter length 10. MAC introducer Procedure performed using ultrasound guided technique. Ultrasound Notes:anatomy identified, needle tip was noted to be adjacent to the nerve/plexus identified, no ultrasound evidence of intravascular and/or intraneural injection and image(s) printed for medical record Attempts: 1 Following insertion, line sutured, dressing applied and Biopatch. Post procedure assessment: blood return through all ports, free fluid flow and no air  Patient tolerated the procedure well with no immediate complications.

## 2022-02-21 NOTE — Progress Notes (Signed)
  Echocardiogram Echocardiogram Transesophageal has been performed.  Cathy Love L Cathy Love 02/21/2022, 8:12 AM 

## 2022-02-21 NOTE — Procedures (Signed)
Extubation Procedure Note  Patient Details:   Name: Cathy Love DOB: 1946/05/06 MRN: [id number]   Airway Documentation:  Airway 7.5 mm (Active)  Secured at (cm) 21 cm 02/21/22 1509  Measured From Lips 02/21/22 1509  Secured Location Center 02/21/22 1509  Secured By [employer] 02/21/22 1509  Tube Holder Repositioned Yes 02/21/22 1509  Prone position No 02/21/22 1509  Site Condition Dry 02/21/22 1509   Vent end date: 02/21/22 Vent end time: 1925   Evaluation  O2 sats: stable throughout Complications: No apparent complications Patient did tolerate procedure well. Bilateral Breath Sounds: Clear, Diminished  NIF= -30 Vital Capacity= .650. No stridor heard over upper airway.Placed patient on 4lpm [place]  Yes  Leafy Half 02/21/2022, 7:28 PM

## 2022-02-21 NOTE — Anesthesia Postprocedure Evaluation (Signed)
Anesthesia Post Note  Patient: Cathy Love  Procedure(s) Performed: CORONARY ARTERY BYPASS GRAFTING (CABG) X BYPASSES USING OPEN LEFT INTERNAL MAMMARY ARTERY AND ENDOSCOPIC LEFT GREATER SAPHENOUS VEIN HARVEST. (Chest) TRANSESOPHAGEAL ECHOCARDIOGRAM (TEE) (Chest)     Patient location during evaluation: SICU Anesthesia Type: General Level of consciousness: sedated Pain management: pain level controlled Vital Signs Assessment: post-procedure vital signs reviewed and stable Respiratory status: patient remains intubated per anesthesia plan Cardiovascular status: stable Postop Assessment: no apparent nausea or vomiting Anesthetic complications: no   No notable events documented.  Last Vitals:  Vitals:   02/21/22 1530 02/21/22 1545  BP:    Pulse: 79 75  Resp: 12 13  Temp: (!) 35.7 C (!) 35.9 C  SpO2: 100% 100%    Last Pain:  Vitals:   02/21/22 0500  TempSrc: Oral  PainSc:                  Cathy Love

## 2022-02-21 NOTE — Transfer of Care (Signed)
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 (Chest) TRANSESOPHAGEAL ECHOCARDIOGRAM (TEE) (Chest)  Patient Location: ICU  Anesthesia Type:General  Level of Consciousness: sedated and Patient remains intubated per anesthesia plan  Airway & Oxygen Therapy: Patient remains intubated per anesthesia plan and Patient placed on Ventilator (see vital sign flow sheet for setting)  Post-op Assessment: Report given to RN and Post -op Vital signs reviewed and stable  Post vital signs: Reviewed and stable  Last Vitals:  Vitals Value Taken Time  BP 115/76 02/21/22 1416  Temp 35.4 C 02/21/22 1430  Pulse 76 02/21/22 1430  Resp 12 02/21/22 1430  SpO2 100 % 02/21/22 1430  Vitals shown include unvalidated device data.  Last Pain:  Vitals:   02/21/22 0500  TempSrc: Oral  PainSc:       Patients Stated Pain Goal: 0 (02/20/22 0910)  Complications: No notable events documented.

## 2022-02-21 NOTE — Discharge Summary (Addendum)
Physician Discharge Summary       [address]       [place] [id number]             [phone] [id number]    Patient ID: Cathy PancakeRosemarie Love MRN: [id number] DOB/AGE: 77-26-1947 77 y.o.  Admit date: 02/18/2022 Discharge date: 02/26/2022  Admission Diagnoses: Unstable angina (HCC) 2. SEMI (subendocardial myocardial infarction) (HCC) 3. Coronary artery disease  Discharge Diagnoses:  S/p CABG x 3 Expected post op blood loss anemia 3. Obesity 4. History of remote tobacco abuse   Consults: None  Procedure (s):  TRANSESOPHAGEAL ECHOCARDIOGRAM (TEE), CORONARY ARTERY BYPASS GRAFTING (CABG) X 3 (LIMA to LAD, SVG to CIRCUMFLEX, SVG to PDA) USING OPEN LEFT INTERNAL MAMMARY ARTERY AND ENDOSCOPICALLY HARVESTED LEFT GREATER SAPHENOUS VEIN, and RIGHT FEMORAL A LINE by Dr. Donata ClayVan Trigt on 02/21/2022.  HPI: The patient is a 76 year old female we are asked to see in cardiothoracic surgical consultation for consideration of coronary artery surgical revascularization.  The patient presented on 02/18/2022 to the emergency department after developing some jaw pain.  Subsequent to that she developed a tight feeling in her chest.  At that time it did not radiate.  There is no associated dyspnea, nausea, or diaphoresis.  She called EMS and was also told to take an aspirin and at time of arrival symptoms had resolved.  EKG showed subtle lateral T wave inversions in leads I, aVL and no acute ST elevation.  Peak high-sensitivity troponin, and was 584. She ruled in for a NSTEMI.  She has no significant history of cardiac disease.  She also has no significant past medical history.  Cardiology consultation was obtained and she was admitted for further diagnostic evaluation and medical stabilization.  She is found to have severe three-vessel coronary artery disease with preserved left ventricular ejection fraction.  There is mid anterior wall focal hypokinesis.  LVEDP is in the normal range.  She has been started on  aspirin, statin and beta-blocker.  Full report as described below.  Echocardiogram is also been performed and left ventricular ejection fraction by estimation is in the 60 to 65% range.  There are no significant valvular findings.  Full report is also as described below.  She is currently on heparin.  Hospital Course: She underwent a CABG x 3. She was transported from the OR to [facility] ICU in stable condition. She was extubated the evening of surgery. Theone MurdochSwan Ganz and a line were removed early in her post op course. Chest tubes and foley remained for a few days then were removed. She was on Milrinone drip and co ox were checked daily. She was started on Lopressor and Milrinone was later stopped. She was volume overloaded and diuresed accordingly. She was transitioned off the Insulin drip. Her pre op HGA1C was 5.6. Accu checks and SS PRN will be stopped at transfer. She had gas/bloating and was given Simethicone at her request. She then had diarrhea so stool softeners were stopped. She had expected post op blood loss anemia and was transfused on 06/02.  Follow-up CBC showed appropriate rise in hematocrit which remained stable.  She was transferred to 4E progressive care on 02/24/2022.  She continued to gain strength and endurance with mobility.  She remained in a stable sinus rhythm.  The epicardial pacing wires were removed on postop day 4.  She was diuresed for expected volume excess.  After evaluation by the physical therapy team, it was determined she would probably benefit from home PT services along  with a rolling walker and bedside commode.  Home health PT was arranged as well as equipment. She is felt surgically stable for discharge today.    Latest Vital Signs: Blood pressure 111/64, pulse 80, temperature (!) 96.9 F (36.1 C), temperature source Axillary, resp. rate 19, height 4' 8.5" (1.435 m), weight 86.9 kg, SpO2 98 %.  Physical Exam: Cardiovascular: RRR. Pulmonary: Slightly diminished bibasilar breath  sounds Abdomen: Soft, non tender, bowel sounds present. Extremities: Mild bilateral lower extremity edema. Ecchymosis right thigh Wounds: Clean and dry.  No erythema or signs of infection.    Discharge Condition:Stable and discharged to home.  Recent laboratory studies:  Lab Results  Component Value Date   WBC 8.8 02/25/2022   HGB 9.0 (L) 02/25/2022   HCT 26.6 (L) 02/25/2022   MCV 91.1 02/25/2022   PLT 169 02/25/2022   Lab Results  Component Value Date   NA 138 02/26/2022   K 4.1 02/26/2022   CL 104 02/26/2022   CO2 30 02/26/2022   CREATININE 0.74 02/26/2022   GLUCOSE 94 02/26/2022      Diagnostic Studies: DG Chest 2 View  Result Date: 02/26/2022 CLINICAL DATA:  Status post CABG EXAM: CHEST - 2 VIEW COMPARISON:  February 24, 2022 FINDINGS: The heart size and mediastinal contours are stable. Patchy opacity of left perihilar region is identified. Small left pleural effusion is noted. The visualized skeletal structures are stable. IMPRESSION: Patchy opacity of left perihilar region, pneumonia is not excluded. Small left pleural effusion. Electronically Signed   By: Sherian Rein M.D.   On: 02/26/2022 06:52   CARDIAC CATHETERIZATION  Result Date: 02/20/2022 CONCLUSIONS: Right dominant anatomy with proximal to mid 90% calcified stenosis in RCA. Short left main Proximal LAD 85 to 90% stenosis followed by post stenotic dilatation/aneurysm.  LAD contains diffuse disease in the midsegment up to 80% after the origin of the second diagonal. Circumflex is large gives origin to 4 obtuse marginal branches.  2 branches are very small.  The first branch contains segmental 95% stenosis ostial to proximal.  The distal circumflex contains 80% stenosis before the origin of the large fourth obtuse marginal. Mid anterior wall focal hypokinesis.  Overall EF 55 to 60%.  EDP is normal. RECOMMENDATIONS: Resume IV heparin Aggressive risk factor modification T CTS consultation for CABG.  Will need to remain in  hospital as she presented with non-ST elevation MI awakening with spontaneous jaw and chest discomfort which lasted approximately 2 hours.   DG Chest Port 1 View  Result Date: 02/24/2022 CLINICAL DATA:  History of CABG. EXAM: PORTABLE CHEST 1 VIEW COMPARISON:  February 23, 2022 FINDINGS: The patient is status post CABG. Sternotomy wires are intact. Chest tubes in a right IJ sheath are stable. No pneumothorax. Mild atelectasis in the bases. No other interval changes. IMPRESSION: 1. Support apparatus as above.  No pneumothorax. 2. Bibasilar atelectasis. 3. No other changes. Electronically Signed   By: Gerome Sam III M.D.   On: 02/24/2022 09:13   DG Chest Port 1 View  Result Date: 02/23/2022 CLINICAL DATA:  CABG. EXAM: PORTABLE CHEST 1 VIEW COMPARISON:  One-view chest x-ray 02/22/2022 FINDINGS: The Swan-Ganz catheter was removed.  Right IJ sheath remains. Bilateral chest tube is in mediastinal drain are in place. No significant pneumothorax is present. Left greater than right pleural effusion and basilar atelectasis remains. Aeration is slightly improved. IMPRESSION: 1. Interval removal of Swan-Ganz catheter. 2. Slight improved aeration with persistent left greater than right pleural effusion and basilar atelectasis.  Electronically Signed   By: Marin Roberts M.D.   On: 02/23/2022 08:09   DG Chest Port 1 View  Result Date: 02/22/2022 CLINICAL DATA:  CABG yesterday. EXAM: PORTABLE CHEST 1 VIEW COMPARISON:  Chest x-ray from yesterday. FINDINGS: Interval removal of the endotracheal and enteric tubes. Unchanged right internal jugular Swan-Ganz catheter and bilateral chest tubes. Stable cardiomediastinal silhouette status post CABG. Continued left greater than right basilar atelectasis. No pneumothorax or large pleural effusion. No acute osseous abnormality. IMPRESSION: 1. Interval extubation. Unchanged bibasilar atelectasis. Electronically Signed   By: Obie Dredge M.D.   On: 02/22/2022 08:14   DG CHEST  PORT 1 VIEW  Result Date: 02/21/2022 CLINICAL DATA:  ETT adjustment EXAM: PORTABLE CHEST 1 VIEW COMPARISON:  Same day radiograph FINDINGS: Interval retraction of ET tube, now approximately 3.2 cm above the carina. Enteric tube, right IJ pulmonary arterial catheter, and bilateral chest tubes remain in place. Prior sternotomy and CABG. Stable heart size. Low lung volumes. No pleural effusion or pneumothorax. IMPRESSION: 1. Interval retraction of ET tube, now approximately 3.2 cm above the carina. 2. Otherwise, stable chest. Electronically Signed   By: Duanne Guess D.O.   On: 02/21/2022 15:47   DG Chest Port 1 View  Result Date: 02/21/2022 CLINICAL DATA:  Status post CABG. EXAM: PORTABLE CHEST 1 VIEW COMPARISON:  02/18/2022 FINDINGS: Status post CABG. Endotracheal tube tip is just barely above the carina. Swan-Ganz catheter tip is in the proximal right pulmonary artery. Bilateral chest tubes in place without pneumothorax. Gastric decompression tube extends into the stomach. Low lung volumes with bibasilar atelectasis. No overt pulmonary edema or significant pleural effusions. IMPRESSION: Endotracheal tube tip is just barely above the carina. Lungs demonstrate low volumes with bibasilar atelectasis. No pneumothorax identified. Electronically Signed   By: Irish Lack M.D.   On: 02/21/2022 14:37   DG Chest Port 1 View  Result Date: 02/18/2022 CLINICAL DATA:  Chest pain EXAM: PORTABLE CHEST 1 VIEW COMPARISON:  None Available. FINDINGS: 0538 hours. The lungs are clear without focal pneumonia, edema, pneumothorax or pleural effusion. Interstitial markings are diffusely coarsened with chronic features. Cardiopericardial silhouette is at upper limits of normal for size. The visualized bony structures of the thorax are unremarkable. Telemetry leads overlie the chest. IMPRESSION: No active disease. Electronically Signed   By: Kennith Center M.D.   On: 02/18/2022 05:56   ECHOCARDIOGRAM COMPLETE  Result Date:  02/18/2022    ECHOCARDIOGRAM REPORT   Patient Name:   Cathy Love Date of Exam: 02/18/2022 Medical Rec #:  [id number]        Height:       56.5 in Accession #:    [id number]       Weight:       183.6 lb Date of Birth:  1945-12-18       BSA:          1.723 m Patient Age:    75 years         BP:           134/85 mmHg Patient Gender: F                HR:           60 bpm. Exam Location:  Inpatient Procedure: 2D Echo, Cardiac Doppler, Color Doppler and Strain Analysis Indications:    Chest pain                 MI  History:  Patient has no prior history of Echocardiogram examinations.  Sonographer:    Neomia Dear RDCS Referring Phys: [id number] LAURA R INGOLD  Sonographer Comments: Suboptimal parasternal window, suboptimal apical window and suboptimal subcostal window. IMPRESSIONS  1. Left ventricular ejection fraction, by estimation, is 60 to 65%. The left ventricle has normal function. The left ventricle has no regional wall motion abnormalities. Left ventricular diastolic parameters were normal.  2. Right ventricular systolic function is normal. The right ventricular size is normal.  3. The mitral valve is normal in structure. No evidence of mitral valve regurgitation. No evidence of mitral stenosis.  4. The aortic valve is tricuspid. There is mild calcification of the aortic valve. There is mild thickening of the aortic valve. Aortic valve regurgitation is not visualized. Aortic valve sclerosis is present, with no evidence of aortic valve stenosis.  5. The inferior vena cava is normal in size with greater than 50% respiratory variability, suggesting right atrial pressure of 3 mmHg. FINDINGS  Left Ventricle: Left ventricular ejection fraction, by estimation, is 60 to 65%. The left ventricle has normal function. The left ventricle has no regional wall motion abnormalities. The left ventricular internal cavity size was normal in size. There is  no left ventricular hypertrophy. Left ventricular diastolic parameters  were normal. Right Ventricle: The right ventricular size is normal. No increase in right ventricular wall thickness. Right ventricular systolic function is normal. Left Atrium: Left atrial size was normal in size. Right Atrium: Right atrial size was normal in size. Pericardium: There is no evidence of pericardial effusion. Mitral Valve: The mitral valve is normal in structure. No evidence of mitral valve regurgitation. No evidence of mitral valve stenosis. Tricuspid Valve: The tricuspid valve is normal in structure. Tricuspid valve regurgitation is not demonstrated. No evidence of tricuspid stenosis. Aortic Valve: The aortic valve is tricuspid. There is mild calcification of the aortic valve. There is mild thickening of the aortic valve. Aortic valve regurgitation is not visualized. Aortic valve sclerosis is present, with no evidence of aortic valve stenosis. Aortic valve mean gradient measures 4.0 mmHg. Aortic valve peak gradient measures 7.8 mmHg. Aortic valve area, by VTI measures 1.73 cm. Pulmonic Valve: The pulmonic valve was normal in structure. Pulmonic valve regurgitation is not visualized. No evidence of pulmonic stenosis. Aorta: The aortic root is normal in size and structure. Venous: The inferior vena cava is normal in size with greater than 50% respiratory variability, suggesting right atrial pressure of 3 mmHg. IAS/Shunts: No atrial level shunt detected by color flow Doppler.  LEFT VENTRICLE PLAX 2D LVIDd:         4.00 cm     Diastology LVIDs:         1.90 cm     LV e' medial:    7.43 cm/s LV PW:         1.30 cm     LV E/e' medial:  5.7 LV IVS:        1.20 cm     LV e' lateral:   6.83 cm/s LVOT diam:     2.10 cm     LV E/e' lateral: 6.3 LV SV:         52 LV SV Index:   30 LVOT Area:     3.46 cm  LV Volumes (MOD) LV vol d, MOD A2C: 41.3 ml LV vol d, MOD A4C: 36.6 ml LV vol s, MOD A2C: 22.7 ml LV vol s, MOD A4C: 19.9 ml LV SV MOD A2C:  18.6 ml LV SV MOD A4C:     36.6 ml LV SV MOD BP:      18.7 ml  RIGHT VENTRICLE RV S prime:     9.60 cm/s TAPSE (M-mode): 1.6 cm LEFT ATRIUM             Index LA diam:        3.40 cm 1.97 cm/m LA Vol (A2C):   33.0 ml 19.15 ml/m LA Vol (A4C):   44.9 ml 26.05 ml/m LA Biplane Vol: 39.7 ml 23.04 ml/m  AORTIC VALVE                    PULMONIC VALVE AV Area (Vmax):    1.81 cm     PV Vmax:       0.77 m/s AV Area (Vmean):   1.71 cm     PV Vmean:      55.000 cm/s AV Area (VTI):     1.73 cm     PV VTI:        0.191 m AV Vmax:           140.00 cm/s  PV Peak grad:  2.4 mmHg AV Vmean:          94.300 cm/s  PV Mean grad:  1.0 mmHg AV VTI:            0.301 m AV Peak Grad:      7.8 mmHg AV Mean Grad:      4.0 mmHg LVOT Vmax:         73.20 cm/s LVOT Vmean:        46.600 cm/s LVOT VTI:          0.150 m LVOT/AV VTI ratio: 0.50  AORTA Ao Root diam: 2.40 cm Ao Asc diam:  2.70 cm MITRAL VALVE MV Area (PHT): 2.99 cm    SHUNTS MV Decel Time: 254 msec    Systemic VTI:  0.15 m MV E velocity: 42.70 cm/s  Systemic Diam: 2.10 cm MV A velocity: 70.30 cm/s MV E/A ratio:  0.61 Charlton Haws MD Electronically signed by Charlton Haws MD Signature Date/Time: 02/18/2022/3:49:03 PM    Final    ECHO INTRAOPERATIVE TEE  Result Date: 02/21/2022  *INTRAOPERATIVE TRANSESOPHAGEAL REPORT *  Patient Name:   Cathy Love Date of Exam: 02/21/2022 Medical Rec #:  [id number]        Height:       56.5 in Accession #:    [id number]       Weight:       182.5 lb Date of Birth:  June 05, 1946       BSA:          1.72 m Patient Age:    75 years         BP:           128/77 mmHg Patient Gender: F                HR:           52 bpm. Exam Location:  Anesthesiology Transesophogeal exam was perform intraoperatively during surgical procedure. Patient was closely monitored under general anesthesia during the entirety of examination. Indications:     Coronary Artery Disease Sonographer:     Eulah Pont RDCS Performing Phys: [id number] Newell Frater Diagnosing Phys: Val Eagle MD Complications: No known complications during  this procedure. POST-OP IMPRESSIONS _ Left Ventricle: has normal systolic function, with an ejection fraction of 65%. The cavity size was normal.  The wall motion is normal. _ Right Ventricle: normal function. The cavity was normal. The wall motion is normal. _ Aorta: there is no dissection present in the aorta. _ Aortic Valve: The aortic valve appears unchanged from pre-bypass. _ Mitral Valve: The mitral valve appears unchanged from pre-bypass. _ Tricuspid Valve: The tricuspid valve appears unchanged from pre-bypass. PRE-OP FINDINGS  Left Ventricle: The left ventricle has normal systolic function, with an ejection fraction of 60-65%. The cavity size was normal. No evidence of left ventricular regional wall motion abnormalities. There is borderline concentric left ventricular hypertrophy. Right Ventricle: The right ventricle has normal systolic function. The cavity was normal. There is increased right ventricular wall thickness. Na RWMA. Left Atrium: Left atrial size was normal in size. No left atrial/left atrial appendage thrombus was detected. Left atrial appendage velocity is normal at greater than 40 cm/s. Right Atrium: Right atrial size was normal in size. Interatrial Septum: No atrial level shunt detected by color flow Doppler. There is no evidence of a patent foramen ovale. Pericardium: A small pericardial effusion is present. The pericardial effusion is localized near the right ventricle and posterior to the left ventricle. Mitral Valve: The mitral valve is normal in structure. There is mild mitral annular calcification present. Mitral valve regurgitation is moderate by color flow Doppler. The MR jet is centrally-directed. There is No evidence of mitral stenosis. There is moderate thickening and mild calcification present on the mitral valve A2 cusp with normal mobility and there is mild thickening and mild calcification present on the mitral valve posterior cusp with normal mobility. No mitral prolapse.  Tricuspid Valve: The tricuspid valve was normal in structure. Tricuspid valve regurgitation is moderate by color flow Doppler. The jet is directed toward the atrial septum. No evidence of tricuspid stenosis is present. PA catheter placement over posterior leaflet hinders copatation likely conributing to severity of TR. Aortic Valve: The aortic valve is tricuspid Aortic valve regurgitation is trivial by color flow Doppler. The jet is centrally-directed. There is mild stenosis of the aortic valve, with a calculated valve area of 1.25 cm. There is mild thickening and mild calcification present on the aortic valve non-coronary, left coronary and right coronary cusps with mildly decreased mobility. Pulmonic Valve: The pulmonic valve was normal in structure, with normal. Pulmonic valve regurgitation is trivial by color flow Doppler. 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 +--------------+--------++ LEFT VENTRICLE         +--------------+--------++ PLAX 2D                +--------------+--------++ LVOT diam:    2.05 cm  +--------------+--------++ LVOT Area:    3.30 cm +--------------+--------++                        +--------------+--------++ +------------------+------------++ AORTIC VALVE                   +------------------+------------++ AV Area (Vmax):   1.38 cm     +------------------+------------++ AV Area (Vmean):  1.53 cm     +------------------+------------++ AV Area (VTI):    1.25 cm     +------------------+------------++ AV Vmax:          206.00 cm/s  +------------------+------------++ AV Vmean:         127.000 cm/s +------------------+------------++ AV VTI:           0.500 m      +------------------+------------++ AV Peak Grad:     17.0 mmHg    +------------------+------------++  AV Mean Grad:     8.0 mmHg     +------------------+------------++ LVOT Vmax:        86.25 cm/s   +------------------+------------++ LVOT Vmean:        59.050 cm/s  +------------------+------------++ LVOT VTI:         0.189 m      +------------------+------------++ LVOT/AV VTI ratio:0.38         +------------------+------------++  +--------------+-------++ AORTA                 +--------------+-------++ Ao Sinus diam:2.30 cm +--------------+-------++ Ao STJ diam:  2.1 cm  +--------------+-------++ +-------------+---------++ MITRAL VALVE             +--------------+-------+ +-------------+---------++   SHUNTS                MV Peak grad:2.4 mmHg    +--------------+-------+ +-------------+---------++   Systemic VTI: 0.19 m  MV Mean grad:1.0 mmHg    +--------------+-------+ +-------------+---------++   Systemic Diam:2.05 cm MV Vmax:     0.77 m/s    +--------------+-------+ +-------------+---------++ MV Vmean:    34.9 cm/s +-------------+---------++ MV VTI:      0.25 m    +-------------+---------++ +-------------+-----------++ MR Peak grad:102.8 mmHg  +-------------+-----------++ MR Mean grad:69.0 mmHg   +-------------+-----------++ MR Vmax:     507.00 cm/s +-------------+-----------++ MR Vmean:    392.0 cm/s  +-------------+-----------++  Val Eagle MD Electronically signed by Val Eagle MD Signature Date/Time: 02/21/2022/5:19:15 PM    Final        Discharge Instructions     Amb Referral to Cardiac Rehabilitation   Complete by: As directed    Diagnosis: CABG   CABG X ___: 3   After initial evaluation and assessments completed: Virtual Based Care may be provided alone or in conjunction with Phase 2 Cardiac Rehab based on patient barriers.: Yes       Discharge Medications: Allergies as of 02/26/2022   No Known Allergies      Medication List     STOP taking these medications    Fluad Quadrivalent 0.5 ML injection Generic drug: influenza vaccine adjuvanted   ibuprofen 200 MG tablet Commonly known as: ADVIL   Moderna COVID-19 Bival Booster 50 MCG/0.5ML  injection Generic drug: COVID-19 mRNA bivalent vaccine (Moderna)       TAKE these medications    aspirin EC 81 MG tablet Take 324 mg by mouth once. Swallow whole.   atorvastatin 80 MG tablet Commonly known as: LIPITOR Take 1 tablet (80 mg total) by mouth daily. Start taking on: February 27, 2022   clopidogrel 75 MG tablet Commonly known as: PLAVIX Take 1 tablet (75 mg total) by mouth daily.   furosemide 40 MG tablet Commonly known as: LASIX Take 1 tablet (40 mg total) by mouth daily. For 5 days then stop   metoprolol tartrate 25 MG tablet Commonly known as: LOPRESSOR Take 0.5 tablets (12.5 mg total) by mouth 2 (two) times daily.   potassium chloride SA 20 MEQ tablet Commonly known as: KLOR-CON M Take 1 tablet (20 mEq total) by mouth daily. For 5 days then stop.   traMADol 50 MG tablet Commonly known as: ULTRAM Take 1 tablet (50 mg total) by mouth every 6 (six) hours as needed for moderate pain.               Durable Medical Equipment  (From admission, onward)           Start     Ordered   02/25/22 [phone]  For home use only DME Walker rolling  Once       Question Answer Comment  Walker: With 5 Inch Wheels   Patient needs a walker to treat with the following condition Imbalance      02/25/22 0951   02/25/22 0951  For home use only DME 3 n 1  Once        02/25/22 0951           The patient has been discharged on:   1.Beta Blocker:  Yes [  x ]                              No   [   ]                              If No, reason:  2.Ace Inhibitor/ARB: Yes [   ]                                     No  [   x ]                                     If No, reason:Labile BP  3.Statin:   Yes [ x  ]                  No  [   ]                  If No, reason:  4.Ecasa:  Yes  [  x ]                  No   [   ]                  If No, reason:  Patient had ACS upon admission:Yes  Plavix/P2Y12 inhibitor: Yes [  x ]                                      No  [    ]   Follow Up Appointments:  Follow-up Information     Dr. Donata Clay. Go on 03/26/2022.   Why: PA/LAT CXR to be taken (at [facility] which is in the same building as Dr. Zenaida Niece Trigt's office) on 07/03 at 2:00 pm;Appointment time is at 2:30 pm Contact information: [address], [place], [place] [address] [phone]        [facility] [place]. Go on 03/02/2022.   Specialty: Cardiothoracic Surgery Why: Appointment is with nurse only for chest tube suture removal. Appointment time is at 10:00 am Contact information: [address] [place], [address] [place] [address] [phone]        Dyann Kief, [place]. Go on 03/20/2022.   Specialty: Cardiology Why: Appointment time is at 11:45 am Contact information: [address] [place] [id number] [phone]                 Signed: Lelon Huh [facility] 02/26/2022, 11:40 AM  Discharge instructions reviewed with patient patient examined and medical record  reviewed,agree with above note. Lovett Sox 02/26/2022

## 2022-02-21 NOTE — Progress Notes (Signed)
RT NOTE: RT pulled ETT back 2 cm per MD to 21 @ lips.

## 2022-02-21 NOTE — Progress Notes (Signed)
Pre Procedure note for inpatients:   Cathy Love has been scheduled for Procedure(s): CORONARY ARTERY BYPASS GRAFTING (CABG) (N/A) TRANSESOPHAGEAL ECHOCARDIOGRAM (TEE) (N/A) today. The various methods of treatment have been discussed with the patient. After consideration of the risks, benefits and treatment options the patient has consented to the planned procedure.   The patient has been seen and labs reviewed. There are no changes in the patient's condition to prevent proceeding with the planned procedure today.  Recent labs:  Lab Results  Component Value Date   WBC 6.0 02/21/2022   HGB 13.5 02/21/2022   HCT 41.4 02/21/2022   PLT 181 02/21/2022   GLUCOSE 96 02/21/2022   CHOL 220 (H) 02/19/2022   TRIG 172 (H) 02/19/2022   HDL 42 02/19/2022   LDLCALC 144 (H) 02/19/2022   ALT 15 02/20/2022   AST 22 02/20/2022   NA 141 02/21/2022   K 4.2 02/21/2022   CL 108 02/21/2022   CREATININE 0.68 02/21/2022   BUN 14 02/21/2022   CO2 27 02/21/2022   TSH 1.171 02/20/2022   INR 1.0 02/19/2022   HGBA1C 5.6 02/21/2022    Lovett Sox, MD 02/21/2022 7:24 AM

## 2022-02-21 NOTE — Anesthesia Procedure Notes (Signed)
Central Venous Catheter Insertion Performed by: Val Eagle, MD, anesthesiologist Start/End5/31/2023 7:18 AM, 02/21/2022 7:28 AM Patient location: Pre-op. Preanesthetic checklist: patient identified, IV checked, risks and benefits discussed, surgical consent, monitors and equipment checked, pre-op evaluation, timeout performed and anesthesia consent Hand hygiene performed  and maximum sterile barriers used  PA cath was placed.Swan type:thermodilution Procedure performed without using ultrasound guided technique. Attempts: 1 Patient tolerated the procedure well with no immediate complications.

## 2022-02-21 NOTE — Progress Notes (Signed)
ANTICOAGULATION CONSULT NOTE - Follow Up Consult  Pharmacy Consult for heparin Indication:  CAD awaiting CABG  Labs: Recent Labs    02/18/22 0450 02/18/22 0713 02/18/22 1819 02/18/22 2104 02/19/22 0150 02/20/22 0342 02/20/22 0626 02/20/22 2317  HGB 14.3  --   --   --  14.2 13.2 14.0  --   HCT 44.3  --   --   --  43.4 40.6 43.9  --   PLT 211  --   --   --  217 181 210  --   LABPROT  --   --   --   --  13.0  --   --   --   INR  --   --   --   --  1.0  --   --   --   HEPARINUNFRC  --   --    < >  --  0.34 0.37  --  0.25*  CREATININE 0.74  --   --   --  0.77  --  0.69  --   TROPONINIHS 36* 182*  --  584*  --   --   --   --    < > = values in this interval not displayed.    Assessment/Plan:  76yo female subtherapeutic on heparin after resumed post-cath but was previously therapeutic at this rate and likely needs more time to accumulate. Will continue infusion at current rate of 1000 units/hr until off for OR in am.   Vernard Gambles, PharmD, BCPS  02/21/2022,1:15 AM

## 2022-02-21 NOTE — Op Note (Signed)
[name], Love MEDICAL RECORD NO: [id number] ACCOUNT NO: [id number] DATE OF BIRTH: 08-Sep-1946 FACILITY: [facility] LOCATION: [facility] PHYSICIAN: Kerin Perna III, MD  Operative Report   DATE OF PROCEDURE: 02/21/2022  OPERATION:   1.  Coronary artery bypass grafting x3 (left internal mammary artery to LAD, saphenous vein graft to circumflex marginal, saphenous vein graft to right coronary artery). 2.  Endoscopic harvest of left leg greater saphenous vein.  PREOPERATIVE DIAGNOSES:   1.  Acute coronary syndrome with non-ST elevation myocardial infarction. 2.  Severe 3-vessel coronary artery disease. 3.  Obesity. 4.  Hyperlipidemia.  POSTOPERATIVE DIAGNOSES:   1.  Acute coronary syndrome with non-ST elevation myocardial infarction. 2.  Severe 3-vessel coronary artery disease. 3.  Obesity. 4.  Hyperlipidemia.  SURGEON:  Mikey Bussing, MD  ASSISTANT:  Doree Fudge, PA-C.  A surgical first assistant was needed due to the complex challenges of this procedure.  A surgical first assistant was also part of the standard of care for cardiac surgery at this institution.  This surgical first assistant was needed to harvest the  vein from the leg endoscopically and to close the leg incisions.  The first assistant was also required to assist with the distal coronary anastomoses with suture management, suctioning, exposure, and general assistance.  ANESTHESIA:  General.  CLINICAL NOTE:  The patient is a 76 year old, 4 feet 8 inches, overweight female with dyslipidemia and recent symptoms of acute coronary syndrome with chest pain and dyspnea on exertion.  She had positive cardiac enzymes and cardiac catheterization  demonstrated severe multivessel coronary artery disease with fairly well preserved LV systolic function.  Surgical coronary revascularization was recommended.  I agreed with the recommendation by her cardiologist that coronary bypass surgery would be her best long-term  therapy for her severe multivessel coronary artery disease.  I discussed the major aspects of the procedure including the use of general  anesthesia in cardiopulmonary bypass, the length of the hospital stay, the location of the surgical incision, and the potential postoperative risks including stroke, bleeding, infection, organ failure, death.  She understood the benefits would include  preservation of LV function, relief of symptoms of angina, and improve long-term survival.  She agreed to proceed with surgery under what I felt was an informed consent.  OPERATIVE FINDINGS:   1.  Adequate conduit from the left leg.  The right leg vein was too small and scarred to use. 2.  Heavily diseased coronary vessels with thickened walls, but 3 grafts successfully placed with good LV function after separation from cardiopulmonary bypass. 3.  Ramus intermedius branch was too small to graft.  DESCRIPTION OF PROCEDURE:  The patient was brought from preoperative holding where informed consent was documented and final issues were addressed with the patient in a face-to-face encounter.  The patient was then placed supine on the operating table  and general anesthesia was induced.  She remained stable.  A transesophageal echo probe was placed by the anesthesia team.  The patient was then prepped and draped as a sterile field.  A proper timeout was performed.  A sternal incision was made as the saphenous vein was harvested from the left leg.  The right leg was explored, but there was no adequate usable vein.  The left  internal mammary artery was a 1.5 mm vessel with excellent flow.  The sternal retractor was placed using the deep blades.  The patient's body habitus was challenging to provide adequate exposure since she was so short and overweight.  The pericardium was opened and suspended.  Pursestrings were placed in the ascending  aorta and right atrium.  After heparin was administered, the ACT was documented as  being therapeutic.  The patient was then cannulated and placed on cardiopulmonary bypass.  The coronaries were identified for grafting.  The right coronary artery,  circumflex marginal, and distal LAD were found to be adequate targets.  Cardioplegia cannulas were placed for antegrade and retrograde cold blood cardioplegia.  The patient was cooled to 32 degrees.  The mammary artery and vein grafts had been prepared  for the distal anastomoses.  The crossclamp was applied and 1 liter of cold blood cardioplegia was delivered in split doses between the antegrade aortic and retrograde coronary sinus catheters.  There was good cardioplegic arrest and septal temperature dropped less than 12 degrees.   Cardioplegia was delivered every 20 minutes.  The distal coronary anastomoses were then performed.  The first distal anastomosis was to the right coronary artery.  This was a 1.5 mm vessel with proximal 90% stenosis.  A reverse saphenous vein was sewn end-to-side with running 7-0 Prolene.  There was  good flow through the graft.  Cardioplegia was redosed.  The second distal anastomosis was to the distal circumflex marginal.  It was 1.2 mm vessel with proximal 90% stenosis.  A reverse saphenous vein was sewn end-to-side with running 7-0 Prolene.  There was good flow through the graft.  Cardioplegia was  redosed.  The third distal anastomosis was to the distal third LAD.  There was a proximal 90% stenosis.  The left IMA pedicle was brought through an opening and the left lateral pericardium was brought down onto the LAD and sewn end-to-side with running 8-0  Prolene.  There was good flow through the anastomosis after briefly releasing the pedicle bulldog and the mammary artery.  The bulldog was reapplied and the pedicle secured to epicardium with 6-0 Prolenes.  Cardioplegia was redosed.  While the crossclamp was still in place, 2 proximal vein anastomoses were performed on the ascending aorta using a 4.5 mm  punch and running 6-0 Prolene.  Prior to tying down the final proximal anastomosis, air was vented from the coronaries with a dose  of retrograde warm blood cardioplegia.  The crossclamp was removed.  The heart resumed a spontaneous rhythm.  The vein grafts were de-aired and opened.  Each had good flow and hemostasis was documented at the proximal and distal sites.  The patient was rewarmed and reperfused.  Temporary pacing wires were applied.  There were areas of generalized bleeding from the epicardial fat, which was very thick and fragile and friable, which was easily abraded or torn in handling her heart.  These  areas were controlled with topical measures and/or suture ligatures.  After the pacing wires had been placed, the patient was adequately reperfused and the ventilator was resumed and she was weaned from cardiopulmonary bypass without difficulty.  Cardiac output was 4.5 liters per minute.  Protamine was administered without  adverse reaction.  The cannulas were removed.  There was still diffuse coagulopathic oozing and the patient was given FFP with improvement in coagulation function.  The superior pericardial fat was closed over the aorta and vein grafts.  Anterior mediastinal and bilateral pleural tubes were placed and brought out through separate incisions.  The sternum was closed with steel wire.  The patient remained stable.  The  pectoralis fascia was closed with a running #1 Vicryl.  Subcutaneous and skin layers were closed  with running Vicryl and sterile dressings were applied.  Total cardiopulmonary bypass time was 126 minutes.      PAA D: 02/21/2022 3:27:57 pm T: 02/21/2022 11:04:00 pm  JOB: [id number]/ [id number]

## 2022-02-21 NOTE — Anesthesia Preprocedure Evaluation (Signed)
Anesthesia Evaluation  Patient identified by MRN, date of birth, ID band Patient awake    Reviewed: Allergy & Precautions, NPO status , Patient's Chart, lab work & pertinent test results  History of Anesthesia Complications Negative for: history of anesthetic complications  Airway Mallampati: II  TM Distance: >3 FB Neck ROM: Full    Dental  (+) Edentulous Upper, Missing, Dental Advisory Given, Poor Dentition,    Pulmonary neg pulmonary ROS,    breath sounds clear to auscultation       Cardiovascular + angina + CAD and + Past MI   Rhythm:Regular  1. Left ventricular ejection fraction, by estimation, is 60 to 65%. The  left ventricle has normal function. The left ventricle has no regional  wall motion abnormalities. Left ventricular diastolic parameters were  normal.  2. Right ventricular systolic function is normal. The right ventricular  size is normal.  3. The mitral valve is normal in structure. No evidence of mitral valve  regurgitation. No evidence of mitral stenosis.  4. The aortic valve is tricuspid. There is mild calcification of the  aortic valve. There is mild thickening of the aortic valve. Aortic valve  regurgitation is not visualized. Aortic valve sclerosis is present, with  no evidence of aortic valve stenosis.  5. The inferior vena cava is normal in size with greater than 50%  respiratory variability, suggesting right atrial pressure of 3 mmHg.   CONCLUSIONS: ? Right dominant anatomy with proximal to mid 90% calcified stenosis in RCA. ? Short left main ? Proximal LAD 85 to 90% stenosis followed by post stenotic dilatation/aneurysm.  LAD contains diffuse disease in the midsegment up to 80% after the origin of the second diagonal. ? Circumflex is large gives origin to 4 obtuse marginal branches.  2 branches are very small.  The first branch contains segmental 95% stenosis ostial to proximal.  The distal  circumflex contains 80% stenosis before the origin of the large fourth obtuse marginal. ? Mid anterior wall focal hypokinesis.  Overall EF 55 to 60%.  EDP is normal.  RECOMMENDATIONS:  ? Resume IV heparin ? Aggressive risk factor modification ? T CTS consultation for CABG.  Will need to remain in hospital as she presented with non-ST elevation MI awakening with spontaneous jaw and chest discomfort which lasted approximately 2 hours.    Neuro/Psych negative neurological ROS  negative psych ROS   GI/Hepatic negative GI ROS, Neg liver ROS,   Endo/Other  negative endocrine ROS  Renal/GU negative Renal ROSLab Results      Component                Value               Date                      CREATININE               0.68                02/21/2022                 Musculoskeletal negative musculoskeletal ROS (+)   Abdominal   Peds  Hematology negative hematology ROS (+) Lab Results      Component                Value               Date  WBC                      6.0                 02/21/2022                HGB                      13.5                02/21/2022                HCT                      41.4                02/21/2022                MCV                      91.0                02/21/2022                PLT                      181                 02/21/2022              Anesthesia Other Findings   Reproductive/Obstetrics                             Anesthesia Physical Anesthesia Plan  ASA: 4  Anesthesia Plan: General   Post-op Pain Management:    Induction: Intravenous  PONV Risk Score and Plan: 3 and Ondansetron  Airway Management Planned: Oral ETT  Additional Equipment: Arterial line, PA Cath, CVP, TEE and Ultrasound Guidance Line Placement  Intra-op Plan:   Post-operative Plan: Post-operative intubation/ventilation  Informed Consent: I have reviewed the patients History and Physical, chart, labs  and discussed the procedure including the risks, benefits and alternatives for the proposed anesthesia with the patient or authorized representative who has indicated his/her understanding and acceptance.     Dental advisory given  Plan Discussed with: CRNA  Anesthesia Plan Comments:         Anesthesia Quick Evaluation

## 2022-02-21 NOTE — Brief Op Note (Addendum)
02/18/2022 - 02/21/2022  11:57 AM  PATIENT:  Cathy Love  76 y.o. female  PRE-OPERATIVE DIAGNOSIS:  1. S/p SEMI 2. CORONARY ARTERY DISEASE  POST-OPERATIVE DIAGNOSIS:  1. S/p SEMI 2. CORONARY ARTERY DISEASE  PROCEDURE:  TRANSESOPHAGEAL ECHOCARDIOGRAM (TEE), CORONARY ARTERY BYPASS GRAFTING (CABG) X 3 (LIMA to LAD, SVG to CIRCUMFLEX, SVG to PDA) USING OPEN LEFT INTERNAL MAMMARY ARTERY AND ENDOSCOPICALLY HARVESTED LEFT GREATER SAPHENOUS VEIN, and RIGHT FEMORAL A LINE   Vein harvest time: 26 min Vein prep time: 14 min  SURGEON:  Surgeon(s) and Role:    Dahlia Byes, MD - Primary  PHYSICIAN ASSISTANT: Lars Pinks PA-C  ASSISTANTS: Lurlean Nanny RNFA   ANESTHESIA:   general  EBL:  Per perfusion, anesthesia records  BLOOD ADMINISTERED: Two CC PRBC, two FFP, and one  PLTS  DRAINS:  Chest tubes placed in the mediastinal and pleural spaces    COUNTS CORRECT:  YES  DICTATION: .Dragon Dictation  PLAN OF CARE: Admit to inpatient   PATIENT DISPOSITION:  ICU - intubated and hemodynamically stable.   Delay start of Pharmacological VTE agent (>24hrs) due to surgical blood loss or risk of bleeding: no  BASELINE WEIGHT: 82.8 kg

## 2022-02-22 ENCOUNTER — Encounter (HOSPITAL_COMMUNITY): Payer: Self-pay | Admitting: Cardiothoracic Surgery

## 2022-02-22 ENCOUNTER — Inpatient Hospital Stay (HOSPITAL_COMMUNITY): Payer: PPO

## 2022-02-22 DIAGNOSIS — E785 Hyperlipidemia, unspecified: Secondary | ICD-10-CM | POA: Diagnosis not present

## 2022-02-22 DIAGNOSIS — I214 Non-ST elevation (NSTEMI) myocardial infarction: Secondary | ICD-10-CM | POA: Diagnosis not present

## 2022-02-22 DIAGNOSIS — Z951 Presence of aortocoronary bypass graft: Secondary | ICD-10-CM | POA: Diagnosis not present

## 2022-02-22 LAB — BASIC METABOLIC PANEL
Anion gap: 4 — ABNORMAL LOW (ref 5–15)
Anion gap: 6 (ref 5–15)
BUN: 10 mg/dL (ref 8–23)
BUN: 17 mg/dL (ref 8–23)
CO2: 25 mmol/L (ref 22–32)
CO2: 26 mmol/L (ref 22–32)
Calcium: 7.1 mg/dL — ABNORMAL LOW (ref 8.9–10.3)
Calcium: 7.3 mg/dL — ABNORMAL LOW (ref 8.9–10.3)
Chloride: 109 mmol/L (ref 98–111)
Chloride: 100 mmol/L (ref 98–111)
Creatinine, Ser: 0.52 mg/dL (ref 0.44–1.00)
Creatinine, Ser: 0.85 mg/dL (ref 0.44–1.00)
GFR, Estimated: >60 mL/min (ref >60)
GFR, Estimated: >60 mL/min (ref >60)
Glucose, Bld: 134 mg/dL — ABNORMAL HIGH (ref 70–99)
Glucose, Bld: 194 mg/dL — ABNORMAL HIGH (ref 70–99)
Potassium: 3.9 mmol/L (ref 3.5–5.1)
Potassium: 3.6 mmol/L (ref 3.5–5.1)
Sodium: 138 mmol/L (ref 135–145)
Sodium: 132 mmol/L — ABNORMAL LOW (ref 135–145)

## 2022-02-22 LAB — CBC
HCT: 23.6 % — ABNORMAL LOW (ref 36.0–46.0)
HCT: 23.4 % — ABNORMAL LOW (ref 36.0–46.0)
Hemoglobin: 7.9 g/dL — ABNORMAL LOW (ref 12.0–15.0)
Hemoglobin: 7.8 g/dL — ABNORMAL LOW (ref 12.0–15.0)
MCH: 30.4 pg (ref 26.0–34.0)
MCH: 30.8 pg (ref 26.0–34.0)
MCHC: 33.5 g/dL (ref 30.0–36.0)
MCHC: 33.3 g/dL (ref 30.0–36.0)
MCV: 90.8 fL (ref 80.0–100.0)
MCV: 92.5 fL (ref 80.0–100.0)
Platelets: 138 10*3/uL — ABNORMAL LOW (ref 150–400)
Platelets: 121 10*3/uL — ABNORMAL LOW (ref 150–400)
RBC: 2.6 MIL/uL — ABNORMAL LOW (ref 3.87–5.11)
RBC: 2.53 MIL/uL — ABNORMAL LOW (ref 3.87–5.11)
RDW: 13.5 % (ref 11.5–15.5)
RDW: 14 % (ref 11.5–15.5)
WBC: 11.2 10*3/uL — ABNORMAL HIGH (ref 4.0–10.5)
WBC: 11.1 10*3/uL — ABNORMAL HIGH (ref 4.0–10.5)
nRBC: 0 % (ref 0.0–0.2)
nRBC: 0 % (ref 0.0–0.2)

## 2022-02-22 LAB — GLUCOSE, CAPILLARY
Glucose-Capillary: 109 mg/dL — ABNORMAL HIGH (ref 70–99)
Glucose-Capillary: 123 mg/dL — ABNORMAL HIGH (ref 70–99)
Glucose-Capillary: 130 mg/dL — ABNORMAL HIGH (ref 70–99)
Glucose-Capillary: 133 mg/dL — ABNORMAL HIGH (ref 70–99)
Glucose-Capillary: 136 mg/dL — ABNORMAL HIGH (ref 70–99)
Glucose-Capillary: 138 mg/dL — ABNORMAL HIGH (ref 70–99)
Glucose-Capillary: 140 mg/dL — ABNORMAL HIGH (ref 70–99)
Glucose-Capillary: 142 mg/dL — ABNORMAL HIGH (ref 70–99)
Glucose-Capillary: 144 mg/dL — ABNORMAL HIGH (ref 70–99)
Glucose-Capillary: 149 mg/dL — ABNORMAL HIGH (ref 70–99)
Glucose-Capillary: 151 mg/dL — ABNORMAL HIGH (ref 70–99)
Glucose-Capillary: 162 mg/dL — ABNORMAL HIGH (ref 70–99)
Glucose-Capillary: 162 mg/dL — ABNORMAL HIGH (ref 70–99)
Glucose-Capillary: 30 mg/dL — CL (ref 70–99)

## 2022-02-22 LAB — COOXEMETRY PANEL
Carboxyhemoglobin: 0.9 % (ref 0.5–1.5)
Methemoglobin: 0.8 % (ref 0.0–1.5)
O2 Saturation: 70.4 %
Total hemoglobin: 7.8 g/dL — ABNORMAL LOW (ref 12.0–16.0)

## 2022-02-22 LAB — BPAM FFP
Blood Product Expiration Date: 202305312359
Blood Product Expiration Date: 202306042359
ISSUE DATE / TIME: 202305311229
ISSUE DATE / TIME: 202305311229
Unit Type and Rh: 6200
Unit Type and Rh: 6200

## 2022-02-22 LAB — PREPARE FRESH FROZEN PLASMA
Unit division: 0
Unit division: 0

## 2022-02-22 LAB — MAGNESIUM
Magnesium: 2.3 mg/dL (ref 1.7–2.4)
Magnesium: 2.2 mg/dL (ref 1.7–2.4)

## 2022-02-22 MED ORDER — INSULIN ASPART 100 UNIT/ML IJ SOLN: 0.0000 [IU] | INTRAMUSCULAR | Status: DC

## 2022-02-22 MED ORDER — MILRINONE LACTATE IN DEXTROSE 20-5 MG/100ML-% IV SOLN
0.1250 ug/kg/min | INTRAVENOUS | Status: DC
  Administered 2022-02-22 – 2022-02-24 (×3): 0.125 ug/kg/min via INTRAVENOUS
  Filled 2022-02-22 (×2): qty 100

## 2022-02-22 MED ORDER — LIDOCAINE 5 % EX PTCH
2.0000 | MEDICATED_PATCH | CUTANEOUS | Status: DC
  Administered 2022-02-22: 2 via TRANSDERMAL
  Filled 2022-02-22 (×3): qty 2

## 2022-02-22 MED ORDER — ORAL CARE MOUTH RINSE
15.0000 mL | Freq: Two times a day (BID) | OROMUCOSAL | Status: DC
  Administered 2022-02-22 (×2): 15 mL via OROMUCOSAL

## 2022-02-22 MED ORDER — INSULIN ASPART 100 UNIT/ML IJ SOLN
0.0000 [IU] | INTRAMUSCULAR | Status: DC
  Administered 2022-02-22: 4 [IU] via SUBCUTANEOUS
  Administered 2022-02-22 – 2022-02-24 (×8): 2 [IU] via SUBCUTANEOUS

## 2022-02-22 MED ORDER — POTASSIUM CHLORIDE CRYS ER 20 MEQ PO TBCR
20.0000 meq | EXTENDED_RELEASE_TABLET | ORAL | Status: AC
  Administered 2022-02-22 – 2022-02-23 (×3): 20 meq via ORAL
  Filled 2022-02-22 (×3): qty 1

## 2022-02-22 MED ORDER — MILRINONE LACTATE IN DEXTROSE 20-5 MG/100ML-% IV SOLN: 0.2500 ug/kg/min | INTRAVENOUS | Status: DC

## 2022-02-22 MED ORDER — FUROSEMIDE 10 MG/ML IJ SOLN
20.0000 mg | Freq: Two times a day (BID) | INTRAMUSCULAR | Status: DC
  Administered 2022-02-22 (×2): 20 mg via INTRAVENOUS
  Filled 2022-02-22 (×2): qty 2

## 2022-02-22 MED ORDER — KETOROLAC TROMETHAMINE 15 MG/ML IJ SOLN
15.0000 mg | Freq: Four times a day (QID) | INTRAMUSCULAR | Status: AC
  Administered 2022-02-22 – 2022-02-24 (×8): 15 mg via INTRAVENOUS
  Filled 2022-02-22 (×8): qty 1

## 2022-02-22 NOTE — Progress Notes (Signed)
1 Day Post-Op Procedure(s) (LRB): CORONARY ARTERY BYPASS GRAFTING (CABG) X BYPASSES USING OPEN LEFT INTERNAL MAMMARY ARTERY AND ENDOSCOPIC LEFT GREATER SAPHENOUS VEIN HARVEST. (N/A) TRANSESOPHAGEAL ECHOCARDIOGRAM (TEE) (N/A) Subjective: Mild surgical pain Stable hemodynamics  Objective: Vital signs in last 24 hours: Temp:  [95.7 F (35.4 C)-98.6 F (37 C)] 98.2 F (36.8 C) (06/01 0800) Pulse Rate:  [73-89] 77 (06/01 0800) Cardiac Rhythm: Normal sinus rhythm (06/01 0800) Resp:  [12-34] 25 (06/01 0800) BP: (89-121)/(50-76) 102/57 (06/01 0800) SpO2:  [91 %-100 %] 92 % (06/01 0800) Arterial Line BP: (95-150)/(47-79) 121/50 (06/01 0800) FiO2 (%):  [40 %-50 %] 40 % (05/31 1857) Weight:  [91.8 kg] 91.8 kg (06/01 0500)  Hemodynamic parameters for last 24 hours: PAP: (24-53)/(12-23) 38/19 CO:  [3.2 L/min-6.8 L/min] 5.2 L/min CI:  [1.9 L/min/m2-3.9 L/min/m2] 3 L/min/m2  Intake/Output from previous day: 05/31 0701 - 06/01 0700 In: 6230.8 [I.V.:4342.7; Blood:789; IV Piggyback:1099.1] Out: 3000 [Urine:2210; Blood:400; Chest Tube:390] Intake/Output this shift: Total I/O In: 129.1 [I.V.:95.9; IV Piggyback:33.2] Out: 90 [Urine:40; Chest Tube:50]       Exam    General- alert and comfortable    Neck- no JVD, no cervical adenopathy palpable, no carotid bruit   Lungs- clear without rales, wheezes- no air leak from chest tubes   Cor- regular rate and rhythm, no murmur , gallop   Abdomen- soft, non-tender   Extremities - warm, non-tender, minimal edema   Neuro- oriented, appropriate, no focal weakness   Lab Results: Recent Labs    02/21/22 2051 02/22/22 0415  WBC 12.1* 11.2*  HGB 7.8* 7.9*  HCT 23.9* 23.6*  PLT 147* 138*   BMET:  Recent Labs    02/21/22 0345 02/21/22 0815 02/21/22 1305 02/21/22 1313 02/21/22 2039 02/21/22 2051 02/22/22 0415  NA 141   < > 140   < > 139  --  138  K 4.2   < > 3.6   < > 4.0  --  3.9  CL 108   < > 102  --   --   --  109  CO2 27  --   --    --   --   --  25  GLUCOSE 96   < > 156*  --   --   --  134*  BUN 14   < > 11  --   --   --  10  CREATININE 0.68   < > 0.30*  --   --  0.62 0.52  CALCIUM 9.0  --   --   --   --   --  7.1*   < > = values in this interval not displayed.    PT/INR:  Recent Labs    02/21/22 1438  LABPROT 16.4*  INR 1.3*   ABG    Component Value Date/Time   PHART 7.336 (L) 02/21/2022 2039   HCO3 24.0 02/21/2022 2039   TCO2 25 02/21/2022 2039   ACIDBASEDEF 2.0 02/21/2022 2039   O2SAT 70.4 02/22/2022 0415   CBG (last 3)  Recent Labs    02/22/22 0536 02/22/22 0643 02/22/22 0744  GLUCAP 136* 144* 30*    Assessment/Plan: S/P Procedure(s) (LRB): CORONARY ARTERY BYPASS GRAFTING (CABG) X BYPASSES USING OPEN LEFT INTERNAL MAMMARY ARTERY AND ENDOSCOPIC LEFT GREATER SAPHENOUS VEIN HARVEST. (N/A) TRANSESOPHAGEAL ECHOCARDIOGRAM (TEE) (N/A) Mobilize Diuresis d/c tubes/lines See progression orders Remove Fem a-line  LOS: 4 days    Cathy Love 02/22/2022

## 2022-02-22 NOTE — Progress Notes (Signed)
  Transition of Care [facility]) Screening Note   Patient Details  Name: Cathy Love Date of Birth: 1946/06/13   Transition of Care [facility]) CM/SW Contact:    Milas Gain, [place] Phone Number: 02/22/2022, 3:35 PM    Transition of Care Department [facility]) has reviewed patient and no TOC needs have been identified at this time. We will continue to monitor patient advancement through interdisciplinary progression rounds. If new patient transition needs arise, please place a TOC consult.

## 2022-02-22 NOTE — Plan of Care (Signed)
°  Problem: Respiratory: °Goal: Respiratory status will improve °Outcome: Progressing °  °Problem: Cardiac: °Goal: Will achieve and/or maintain hemodynamic stability °Outcome: Progressing °  °Problem: Urinary Elimination: °Goal: Ability to achieve and maintain adequate renal perfusion and functioning will improve °Outcome: Progressing °  °

## 2022-02-22 NOTE — Progress Notes (Signed)
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 C/o some nasal congestion, minimal chest soreness. Maintaining sinus rhythm. Expected chest tube output. Not pacing - on low dose milrinone and insulin gtts. She is +3L yesterday - started on lasix. BP Remains soft - 90's-100's.  Objective   Vitals:   02/22/22 0630 02/22/22 0645 02/22/22 0700 02/22/22 0800  BP: (!) 92/56 (!) 89/56 (!) 92/56 (!) 102/57  Pulse: 79 80 79 77  Resp: (!) 22 (!) 24 (!) 25 (!) 25  Temp: 98.4 F (36.9 C) 98.4 F (36.9 C) 98.4 F (36.9 C) 98.2 F (36.8 C)  TempSrc:    Core  SpO2: 95% 93% 93% 92%  Weight:      Height:        Intake/Output Summary (Last 24 hours) at 02/22/2022 0843 Last data filed at 02/22/2022 0800 Gross per 24 hour  Intake 6109.82 ml  Output 3090 ml  Net 3019.82 ml   Filed Weights   02/20/22 0002 02/21/22 0459 02/22/22 0500  Weight: 83.2 kg 82.8 kg 91.8 kg    Physical Exam   General appearance: alert, no distress, and morbidly obese Neck: no carotid bruit, no JVD, and thyroid not enlarged, symmetric, no tenderness/mass/nodules Lungs: clear to auscultation bilaterally Heart: regular rate and rhythm Abdomen: soft, non-tender; bowel sounds normal; no masses,  no organomegaly Extremities: cool, no edema Pulses: 1+ pulses Skin: normal color, cool extremities Neurologic: Grossly normal Psych: Pleasant  Inpatient Medications    Scheduled Meds:  acetaminophen  1,000 mg Oral Q6H   Or   acetaminophen (TYLENOL) oral liquid 160 mg/5 mL  1,000 mg Per Tube Q6H   aspirin EC  325 mg Oral Daily   Or   aspirin  324 mg Per Tube Daily   atorvastatin  80 mg Oral Daily   bisacodyl  10 mg Oral Daily   Or   bisacodyl  10 mg Rectal Daily    chlorhexidine gluconate (MEDLINE KIT)  15 mL Mouth Rinse BID   Chlorhexidine Gluconate Cloth  6 each Topical Daily   docusate sodium  200 mg Oral Daily   furosemide  20 mg Intravenous BID   insulin aspart  0-24 Units Subcutaneous Q4H   lidocaine  2 patch Transdermal Q24H   mouth rinse  15 mL Mouth Rinse BID   metoCLOPramide (REGLAN) injection  10 mg Intravenous Q6H   metoprolol tartrate  12.5 mg Oral BID   Or   metoprolol tartrate  12.5 mg Per Tube BID   [START ON 02/23/2022] pantoprazole  40 mg Oral Daily   sodium chloride flush  3 mL Intravenous Q12H    Continuous Infusions:  sodium chloride 20 mL/hr at 02/22/22 0800   sodium chloride     sodium chloride 20 mL/hr at 02/21/22 1415   albumin human 12.5 g (02/21/22 1825)    ceFAZolin (ANCEF) IV Stopped (02/22/22 0609)   insulin 1 Units/hr (02/22/22 0800)   lactated ringers     lactated ringers Stopped (02/21/22 1415)   lactated ringers 20 mL/hr at 02/22/22 0800   milrinone     nitroGLYCERIN Stopped (02/21/22 1415)   norepinephrine (LEVOPHED) Adult infusion Stopped (02/22/22 7106)   phenylephrine (NEO-SYNEPHRINE) Adult infusion Stopped (02/21/22  2048)    PRN Meds: sodium chloride, albumin human, dextrose, lactated ringers, metoprolol tartrate, morphine injection, ondansetron (ZOFRAN) IV, oxyCODONE, sodium chloride flush, traMADol   Labs   Results for orders placed or performed during the hospital encounter of 02/18/22 (from the past 48 hour(s))  Surgical pcr screen     Status: None   Collection Time: 02/20/22 12:55 PM   Specimen: Nasal Mucosa; Nasal Swab  Result Value Ref Range   MRSA, PCR NEGATIVE NEGATIVE   Staphylococcus aureus NEGATIVE NEGATIVE    Comment: (NOTE) The Xpert SA Assay (FDA approved for NASAL specimens in patients 34 years of age and older), is one component of a comprehensive surveillance program. It is not intended to diagnose infection nor to guide or monitor treatment. Performed at [facility], [place] [address] [place], [place] [address]   Type and screen [place]     Status: None (Preliminary result)   Collection Time: 02/20/22  1:03 PM  Result Value Ref Range   ABO/RH(D) O POS    Antibody Screen NEG    Sample Expiration 02/23/2022,2359    Unit Number [id number]    Blood Component Type RED CELLS,LR    Unit division 00    Status of Unit ALLOCATED    Transfusion Status OK TO TRANSFUSE    Crossmatch Result      Compatible Performed at [facility], [place] [address] [place], [place] [address]    Unit Number [id number]    Blood Component Type RED CELLS,LR    Unit division 00    Status of Unit ALLOCATED    Transfusion Status OK TO TRANSFUSE    Crossmatch Result Compatible   Prepare RBC (crossmatch)     Status: None   Collection Time: 02/20/22  1:03 PM  Result Value Ref Range   Order Confirmation      ORDER PROCESSED BY BLOOD BANK Performed at [facility], [place] [address] [place], [place] [address]   Urinalysis, Routine w reflex microscopic Urine, Clean Catch     Status: Abnormal   Collection Time: 02/20/22  1:26 PM  Result Value Ref Range   Color, Urine YELLOW YELLOW   APPearance CLEAR CLEAR   Specific Gravity, Urine 1.026 1.005 - 1.030   pH 7.0 5.0 - 8.0   Glucose, UA NEGATIVE NEGATIVE mg/dL   Hgb urine dipstick NEGATIVE NEGATIVE   Bilirubin Urine NEGATIVE NEGATIVE   Ketones, ur NEGATIVE NEGATIVE mg/dL   Protein, ur NEGATIVE NEGATIVE mg/dL   Nitrite NEGATIVE NEGATIVE   Leukocytes,Ua SMALL (A) NEGATIVE   RBC / HPF 0-5 0 - 5 RBC/hpf   WBC, UA 0-5 0 - 5 WBC/hpf   Bacteria, UA NONE SEEN NONE SEEN   Squamous Epithelial / LPF 0-5 0 - 5    Comment: Performed at [facility], [place] [address], [place], [place] [address]  Blood gas, arterial     Status: Abnormal   Collection Time: 02/20/22  8:09 PM  Result Value Ref Range   pH, Arterial 7.46 (H) 7.35 - 7.45   pCO2 arterial 43 32 - 48 mmHg   pO2, Arterial 72 (L) 83 - 108 mmHg    Bicarbonate 30.6 (H) 20.0 - 28.0 mmol/L   Acid-Base Excess 6.0 (H) 0.0 - 2.0 mmol/L   O2 Saturation 95.2 %   Patient temperature 37.0    Collection site BRACHIAL    Drawn by [id number]    Allens test (pass/fail) PASS PASS  Comment: Performed at [facility], [place] [address], [place], [place] [address]  Heparin level (unfractionated)     Status: Abnormal   Collection Time: 02/20/22 11:17 PM  Result Value Ref Range   Heparin Unfractionated 0.25 (L) 0.30 - 0.70 IU/mL    Comment: (NOTE) The clinical reportable range upper limit is being lowered to >1.10 to align with the FDA approved guidance for the current laboratory assay.  If heparin results are below expected values, and patient dosage has  been confirmed, suggest follow up testing of antithrombin III levels. Performed at [facility], [place] [address] [place], [place] [address]   Heparin level (unfractionated)     Status: None   Collection Time: 02/21/22  3:45 AM  Result Value Ref Range   Heparin Unfractionated 0.32 0.30 - 0.70 IU/mL    Comment: (NOTE) The clinical reportable range upper limit is being lowered to >1.10 to align with the FDA approved guidance for the current laboratory assay.  If heparin results are below expected values, and patient dosage has  been confirmed, suggest follow up testing of antithrombin III levels. Performed at [facility], [place] [address], [place], [place] [address]   CBC     Status: None   Collection Time: 02/21/22  3:45 AM  Result Value Ref Range   WBC 6.0 4.0 - 10.5 K/uL   RBC 4.55 3.87 - 5.11 MIL/uL   Hemoglobin 13.5 12.0 - 15.0 g/dL   HCT 41.4 36.0 - 46.0 %   MCV 91.0 80.0 - 100.0 fL   MCH 29.7 26.0 - 34.0 pg   MCHC 32.6 30.0 - 36.0 g/dL   RDW 13.6 11.5 - 15.5 %   Platelets 181 150 - 400 K/uL   nRBC 0.0 0.0 - 0.2 %    Comment: Performed at [facility], [place] [address], [place], [place] [address]  Basic metabolic panel     Status: None   Collection Time:  02/21/22  3:45 AM  Result Value Ref Range   Sodium 141 135 - 145 mmol/L   Potassium 4.2 3.5 - 5.1 mmol/L   Chloride 108 98 - 111 mmol/L   CO2 27 22 - 32 mmol/L   Glucose, Bld 96 70 - 99 mg/dL    Comment: Glucose reference range applies only to samples taken after fasting for at least 8 hours.   BUN 14 8 - 23 mg/dL   Creatinine, Ser 0.68 0.44 - 1.00 mg/dL   Calcium 9.0 8.9 - 10.3 mg/dL   GFR, Estimated >60 >60 mL/min    Comment: (NOTE) Calculated using the CKD-EPI Creatinine Equation (2021)    Anion gap 6 5 - 15    Comment: Performed at [facility] [address] [place], [place] [address]  Hemoglobin A1c     Status: None   Collection Time: 02/21/22  3:45 AM  Result Value Ref Range   Hgb A1c MFr Bld 5.6 4.8 - 5.6 %    Comment: (NOTE) Pre diabetes:          5.7%-6.4%  Diabetes:              >6.4%  Glycemic control for   <7.0% adults with diabetes    Mean Plasma Glucose 114.02 mg/dL    Comment: Performed at [facility] [address], [place], [place] [address]  I-STAT, [place] 8     Status: Abnormal   Collection Time: 02/21/22  8:15 AM  Result Value Ref Range   Sodium 141 135 - 145 mmol/L   Potassium 4.1 3.5 - 5.1 mmol/L   Chloride 103 98 - 111 mmol/L   BUN 14 8 - 23 mg/dL   Creatinine, Ser 0.40 (L) 0.44 - 1.00 mg/dL   Glucose, Bld 102 (H) 70 - 99 mg/dL    Comment: Glucose reference range applies only to samples taken after fasting for at least 8 hours.   Calcium, Ion 1.25 1.15 - 1.40 mmol/L   TCO2 29 22 - 32 mmol/L   Hemoglobin 11.6 (L) 12.0 - 15.0 g/dL   HCT 34.0 (L) 36.0 - 46.0 %  I-STAT, chem 8     Status: Abnormal   Collection Time: 02/21/22 10:00 AM  Result Value Ref Range   Sodium 139 135 - 145 mmol/L   Potassium 4.1 3.5 - 5.1 mmol/L   Chloride 103 98 - 111 mmol/L   BUN 12 8 - 23 mg/dL   Creatinine, Ser 0.50 0.44 - 1.00 mg/dL   Glucose, Bld 102 (H) 70 - 99 mg/dL    Comment: Glucose reference range applies only to samples taken after fasting for  at least 8 hours.   Calcium, Ion 1.20 1.15 - 1.40 mmol/L   TCO2 30 22 - 32 mmol/L   Hemoglobin 11.6 (L) 12.0 - 15.0 g/dL   HCT 34.0 (L) 36.0 - 46.0 %  I-STAT 7, (LYTES, BLD GAS, ICA, H+H)     Status: Abnormal   Collection Time: 02/21/22 10:23 AM  Result Value Ref Range   pH, Arterial 7.389 7.35 - 7.45   pCO2 arterial 39.2 32 - 48 mmHg   pO2, Arterial 41 (L) 83 - 108 mmHg   Bicarbonate 23.7 20.0 - 28.0 mmol/L   TCO2 25 22 - 32 mmol/L   O2 Saturation 76 %   Acid-base deficit 1.0 0.0 - 2.0 mmol/L   Sodium 139 135 - 145 mmol/L   Potassium 4.5 3.5 - 5.1 mmol/L   Calcium, Ion 0.92 (L) 1.15 - 1.40 mmol/L   HCT 26.0 (L) 36.0 - 46.0 %   Hemoglobin 8.8 (L) 12.0 - 15.0 g/dL   Sample type ARTERIAL   I-STAT 7, (LYTES, BLD GAS, ICA, H+H)     Status: Abnormal   Collection Time: 02/21/22 10:43 AM  Result Value Ref Range   pH, Arterial 7.431 7.35 - 7.45   pCO2 arterial 41.7 32 - 48 mmHg   pO2, Arterial 468 (H) 83 - 108 mmHg   Bicarbonate 27.7 20.0 - 28.0 mmol/L   TCO2 29 22 - 32 mmol/L   O2 Saturation 100 %   Acid-Base Excess 3.0 (H) 0.0 - 2.0 mmol/L   Sodium 139 135 - 145 mmol/L   Potassium 4.1 3.5 - 5.1 mmol/L   Calcium, Ion 0.94 (L) 1.15 - 1.40 mmol/L   HCT 26.0 (L) 36.0 - 46.0 %   Hemoglobin 8.8 (L) 12.0 - 15.0 g/dL   Sample type ARTERIAL   I-STAT 7, (LYTES, BLD GAS, ICA, H+H)     Status: Abnormal   Collection Time: 02/21/22 10:59 AM  Result Value Ref Range   pH, Arterial 7.460 (H) 7.35 - 7.45   pCO2 arterial 37.2 32 - 48 mmHg   pO2, Arterial 445 (H) 83 - 108 mmHg   Bicarbonate 26.5 20.0 - 28.0 mmol/L   TCO2 28 22 - 32 mmol/L   O2 Saturation 100 %   Acid-Base Excess 3.0 (H) 0.0 - 2.0 mmol/L   Sodium 140 135 - 145  mmol/L   Potassium 4.1 3.5 - 5.1 mmol/L   Calcium, Ion 0.99 (L) 1.15 - 1.40 mmol/L   HCT 28.0 (L) 36.0 - 46.0 %   Hemoglobin 9.5 (L) 12.0 - 15.0 g/dL   Sample type ARTERIAL   I-STAT, chem 8     Status: Abnormal   Collection Time: 02/21/22 11:27 AM  Result Value Ref  Range   Sodium 139 135 - 145 mmol/L   Potassium 4.4 3.5 - 5.1 mmol/L   Chloride 102 98 - 111 mmol/L   BUN 10 8 - 23 mg/dL   Creatinine, Ser 0.30 (L) 0.44 - 1.00 mg/dL   Glucose, Bld 140 (H) 70 - 99 mg/dL    Comment: Glucose reference range applies only to samples taken after fasting for at least 8 hours.   Calcium, Ion 0.92 (L) 1.15 - 1.40 mmol/L   TCO2 27 22 - 32 mmol/L   Hemoglobin 7.8 (L) 12.0 - 15.0 g/dL   HCT 23.0 (L) 36.0 - 46.0 %  Prepare RBC (crossmatch)     Status: None   Collection Time: 02/21/22 11:29 AM  Result Value Ref Range   Order Confirmation      ORDER PROCESSED BY BLOOD BANK BB SAMPLE OR UNITS ALREADY AVAILABLE Performed at [facility], [place] [address] [place], [place] [address]   I-STAT 7, (LYTES, BLD GAS, ICA, H+H)     Status: Abnormal   Collection Time: 02/21/22 11:34 AM  Result Value Ref Range   pH, Arterial 7.483 (H) 7.35 - 7.45   pCO2 arterial 34.2 32 - 48 mmHg   pO2, Arterial 406 (H) 83 - 108 mmHg   Bicarbonate 25.7 20.0 - 28.0 mmol/L   TCO2 27 22 - 32 mmol/L   O2 Saturation 100 %   Acid-Base Excess 2.0 0.0 - 2.0 mmol/L   Sodium 139 135 - 145 mmol/L   Potassium 4.4 3.5 - 5.1 mmol/L   Calcium, Ion 0.92 (L) 1.15 - 1.40 mmol/L   HCT 28.0 (L) 36.0 - 46.0 %   Hemoglobin 9.5 (L) 12.0 - 15.0 g/dL   Sample type ARTERIAL   I-STAT, chem 8     Status: Abnormal   Collection Time: 02/21/22 11:54 AM  Result Value Ref Range   Sodium 140 135 - 145 mmol/L   Potassium 4.4 3.5 - 5.1 mmol/L   Chloride 97 (L) 98 - 111 mmol/L   BUN 11 8 - 23 mg/dL   Creatinine, Ser 0.30 (L) 0.44 - 1.00 mg/dL   Glucose, Bld 165 (H) 70 - 99 mg/dL    Comment: Glucose reference range applies only to samples taken after fasting for at least 8 hours.   Calcium, Ion 0.95 (L) 1.15 - 1.40 mmol/L   TCO2 30 22 - 32 mmol/L   Hemoglobin 7.5 (L) 12.0 - 15.0 g/dL   HCT 22.0 (L) 36.0 - 46.0 %  Hemoglobin and hematocrit, blood     Status: Abnormal   Collection Time: 02/21/22 12:08 PM   Result Value Ref Range   Hemoglobin 7.7 (L) 12.0 - 15.0 g/dL    Comment: REPEATED TO VERIFY   HCT 22.9 (L) 36.0 - 46.0 %    Comment: Performed at [facility] [address] [place], [place] [address]  Platelet count     Status: Abnormal   Collection Time: 02/21/22 12:08 PM  Result Value Ref Range   Platelets 141 (L) 150 - 400 K/uL    Comment: Performed at [facility],  [address], [place], [place] [address]  Prepare fresh frozen plasma     Status: None   Collection Time: 02/21/22 12:22 PM  Result Value Ref Range   Unit Number [id number]    Blood Component Type THAWED PLASMA    Unit division 00    Status of Unit ISSUED,FINAL    Transfusion Status      OK TO TRANSFUSE Performed at [facility] [address] [place], [place] [address]    Unit Number [id number]    Blood Component Type THAWED PLASMA    Unit division 00    Status of Unit ISSUED,FINAL    Transfusion Status OK TO TRANSFUSE   I-STAT 7, (LYTES, BLD GAS, ICA, H+H)     Status: Abnormal   Collection Time: 02/21/22 12:34 PM  Result Value Ref Range   pH, Arterial 7.414 7.35 - 7.45   pCO2 arterial 45.8 32 - 48 mmHg   pO2, Arterial 299 (H) 83 - 108 mmHg   Bicarbonate 29.3 (H) 20.0 - 28.0 mmol/L   TCO2 31 22 - 32 mmol/L   O2 Saturation 100 %   Acid-Base Excess 4.0 (H) 0.0 - 2.0 mmol/L   Sodium 140 135 - 145 mmol/L   Potassium 4.1 3.5 - 5.1 mmol/L   Calcium, Ion 0.99 (L) 1.15 - 1.40 mmol/L   HCT 25.0 (L) 36.0 - 46.0 %   Hemoglobin 8.5 (L) 12.0 - 15.0 g/dL   Sample type ARTERIAL   I-STAT, chem 8     Status: Abnormal   Collection Time: 02/21/22  1:05 PM  Result Value Ref Range   Sodium 140 135 - 145 mmol/L   Potassium 3.6 3.5 - 5.1 mmol/L   Chloride 102 98 - 111 mmol/L   BUN 11 8 - 23 mg/dL   Creatinine, Ser 0.30 (L) 0.44 - 1.00 mg/dL   Glucose, Bld 156 (H) 70 - 99 mg/dL    Comment: Glucose reference range applies only to samples taken after fasting for at least 8 hours.   Calcium,  Ion 0.82 (LL) 1.15 - 1.40 mmol/L   TCO2 25 22 - 32 mmol/L   Hemoglobin 8.2 (L) 12.0 - 15.0 g/dL   HCT 24.0 (L) 36.0 - 46.0 %  I-STAT 7, (LYTES, BLD GAS, ICA, H+H)     Status: Abnormal   Collection Time: 02/21/22  1:13 PM  Result Value Ref Range   pH, Arterial 7.437 7.35 - 7.45   pCO2 arterial 35.9 32 - 48 mmHg   pO2, Arterial 196 (H) 83 - 108 mmHg   Bicarbonate 24.3 20.0 - 28.0 mmol/L   TCO2 25 22 - 32 mmol/L   O2 Saturation 100 %   Acid-Base Excess 0.0 0.0 - 2.0 mmol/L   Sodium 142 135 - 145 mmol/L   Potassium 3.7 3.5 - 5.1 mmol/L   Calcium, Ion 0.83 (LL) 1.15 - 1.40 mmol/L   HCT 33.0 (L) 36.0 - 46.0 %   Hemoglobin 11.2 (L) 12.0 - 15.0 g/dL   Sample type ARTERIAL   CBC     Status: Abnormal   Collection Time: 02/21/22  2:38 PM  Result Value Ref Range   WBC 14.0 (H) 4.0 - 10.5 K/uL   RBC 2.83 (L) 3.87 - 5.11 MIL/uL   Hemoglobin 8.5 (L) 12.0 - 15.0 g/dL   HCT 26.1 (L) 36.0 - 46.0 %   MCV 92.2 80.0 - 100.0 fL   MCH 30.0 26.0 - 34.0 pg   MCHC 32.6 30.0 - 36.0  g/dL   RDW 13.6 11.5 - 15.5 %   Platelets 137 (L) 150 - 400 K/uL    Comment: REPEATED TO VERIFY   nRBC 0.0 0.0 - 0.2 %    Comment: Performed at [facility], [place] [address] [place], [place] [address]  Protime-INR     Status: Abnormal   Collection Time: 02/21/22  2:38 PM  Result Value Ref Range   Prothrombin Time 16.4 (H) 11.4 - 15.2 seconds   INR 1.3 (H) 0.8 - 1.2    Comment: (NOTE) INR goal varies based on device and disease states. Performed at [facility], [place] [address] [place], [place] [address]   APTT     Status: None   Collection Time: 02/21/22  2:38 PM  Result Value Ref Range   aPTT 30 24 - 36 seconds    Comment: Performed at [facility] [address], [place], [place] [address]  Glucose, capillary     Status: Abnormal   Collection Time: 02/21/22  2:38 PM  Result Value Ref Range   Glucose-Capillary 143 (H) 70 - 99 mg/dL    Comment: Glucose reference range applies only to samples  taken after fasting for at least 8 hours.  I-STAT 7, (LYTES, BLD GAS, ICA, H+H)     Status: Abnormal   Collection Time: 02/21/22  2:40 PM  Result Value Ref Range   pH, Arterial 7.349 (L) 7.35 - 7.45   pCO2 arterial 49.8 (H) 32 - 48 mmHg   pO2, Arterial 80 (L) 83 - 108 mmHg   Bicarbonate 27.9 20.0 - 28.0 mmol/L   TCO2 29 22 - 32 mmol/L   O2 Saturation 96 %   Acid-Base Excess 1.0 0.0 - 2.0 mmol/L   Sodium 142 135 - 145 mmol/L   Potassium 3.3 (L) 3.5 - 5.1 mmol/L   Calcium, Ion 1.13 (L) 1.15 - 1.40 mmol/L   HCT 24.0 (L) 36.0 - 46.0 %   Hemoglobin 8.2 (L) 12.0 - 15.0 g/dL   Patient temperature 35.5 C    Collection site RADIAL, ALLEN'S TEST ACCEPTABLE    Drawn by HIDE    Sample type ARTERIAL   Glucose, capillary     Status: Abnormal   Collection Time: 02/21/22  3:40 PM  Result Value Ref Range   Glucose-Capillary 152 (H) 70 - 99 mg/dL    Comment: Glucose reference range applies only to samples taken after fasting for at least 8 hours.  Glucose, capillary     Status: Abnormal   Collection Time: 02/21/22  4:38 PM  Result Value Ref Range   Glucose-Capillary 146 (H) 70 - 99 mg/dL    Comment: Glucose reference range applies only to samples taken after fasting for at least 8 hours.   Comment 1 Document in Chart   I-STAT 7, (LYTES, BLD GAS, ICA, H+H)     Status: Abnormal   Collection Time: 02/21/22  4:39 PM  Result Value Ref Range   pH, Arterial 7.399 7.35 - 7.45   pCO2 arterial 41.1 32 - 48 mmHg   pO2, Arterial 121 (H) 83 - 108 mmHg   Bicarbonate 25.5 20.0 - 28.0 mmol/L   TCO2 27 22 - 32 mmol/L   O2 Saturation 99 %   Acid-Base Excess 1.0 0.0 - 2.0 mmol/L   Sodium 141 135 - 145 mmol/L   Potassium 3.9 3.5 - 5.1 mmol/L   Calcium, Ion 1.10 (L) 1.15 - 1.40 mmol/L   HCT 27.0 (L) 36.0 -  46.0 %   Hemoglobin 9.2 (L) 12.0 - 15.0 g/dL   Patient temperature 36.6 C    Collection site art line    Drawn by Operator    Sample type ARTERIAL   Glucose, capillary     Status: None   Collection  Time: 02/21/22  6:04 PM  Result Value Ref Range   Glucose-Capillary 87 70 - 99 mg/dL    Comment: Glucose reference range applies only to samples taken after fasting for at least 8 hours.   Comment 1 Repeat Test   Glucose, capillary     Status: Abnormal   Collection Time: 02/21/22  6:07 PM  Result Value Ref Range   Glucose-Capillary 124 (H) 70 - 99 mg/dL    Comment: Glucose reference range applies only to samples taken after fasting for at least 8 hours.   Comment 1 Document in Chart   Glucose, capillary     Status: Abnormal   Collection Time: 02/21/22  7:18 PM  Result Value Ref Range   Glucose-Capillary 136 (H) 70 - 99 mg/dL    Comment: Glucose reference range applies only to samples taken after fasting for at least 8 hours.   Comment 1 Document in Chart   I-STAT 7, (LYTES, BLD GAS, ICA, H+H)     Status: Abnormal   Collection Time: 02/21/22  7:20 PM  Result Value Ref Range   pH, Arterial 7.373 7.35 - 7.45   pCO2 arterial 42.0 32 - 48 mmHg   pO2, Arterial 82 (L) 83 - 108 mmHg   Bicarbonate 24.5 20.0 - 28.0 mmol/L   TCO2 26 22 - 32 mmol/L   O2 Saturation 96 %   Acid-base deficit 1.0 0.0 - 2.0 mmol/L   Sodium 140 135 - 145 mmol/L   Potassium 4.3 3.5 - 5.1 mmol/L   Calcium, Ion 1.09 (L) 1.15 - 1.40 mmol/L   HCT 24.0 (L) 36.0 - 46.0 %   Hemoglobin 8.2 (L) 12.0 - 15.0 g/dL   Patient temperature 36.7 C    Collection site art line    Drawn by Operator    Sample type ARTERIAL   I-STAT 7, (LYTES, BLD GAS, ICA, H+H)     Status: Abnormal   Collection Time: 02/21/22  8:39 PM  Result Value Ref Range   pH, Arterial 7.336 (L) 7.35 - 7.45   pCO2 arterial 44.8 32 - 48 mmHg   pO2, Arterial 71 (L) 83 - 108 mmHg   Bicarbonate 24.0 20.0 - 28.0 mmol/L   TCO2 25 22 - 32 mmol/L   O2 Saturation 93 %   Acid-base deficit 2.0 0.0 - 2.0 mmol/L   Sodium 139 135 - 145 mmol/L   Potassium 4.0 3.5 - 5.1 mmol/L   Calcium, Ion 1.12 (L) 1.15 - 1.40 mmol/L   HCT 23.0 (L) 36.0 - 46.0 %   Hemoglobin 7.8 (L)  12.0 - 15.0 g/dL   Patient temperature 36.8 C    Collection site art line    Drawn by Operator    Sample type ARTERIAL   Glucose, capillary     Status: Abnormal   Collection Time: 02/21/22  8:40 PM  Result Value Ref Range   Glucose-Capillary 169 (H) 70 - 99 mg/dL    Comment: Glucose reference range applies only to samples taken after fasting for at least 8 hours.  CBC     Status: Abnormal   Collection Time: 02/21/22  8:51 PM  Result Value Ref Range   WBC 12.1 (H) 4.0 -  10.5 K/uL   RBC 2.61 (L) 3.87 - 5.11 MIL/uL   Hemoglobin 7.8 (L) 12.0 - 15.0 g/dL   HCT 23.9 (L) 36.0 - 46.0 %   MCV 91.6 80.0 - 100.0 fL   MCH 29.9 26.0 - 34.0 pg   MCHC 32.6 30.0 - 36.0 g/dL   RDW 13.6 11.5 - 15.5 %   Platelets 147 (L) 150 - 400 K/uL    Comment: REPEATED TO VERIFY   nRBC 0.0 0.0 - 0.2 %    Comment: Performed at [facility], [place] [address] [place], [place] [address]  Creatinine, serum     Status: None   Collection Time: 02/21/22  8:51 PM  Result Value Ref Range   Creatinine, Ser 0.62 0.44 - 1.00 mg/dL   GFR, Estimated >60 >60 mL/min    Comment: (NOTE) Calculated using the CKD-EPI Creatinine Equation (2021) Performed at [facility] [address] [place], [place] [address]   Magnesium     Status: Abnormal   Collection Time: 02/21/22  8:51 PM  Result Value Ref Range   Magnesium 2.6 (H) 1.7 - 2.4 mg/dL    Comment: Performed at [facility] [address] [place], [place] [address]  Glucose, capillary     Status: Abnormal   Collection Time: 02/21/22  9:49 PM  Result Value Ref Range   Glucose-Capillary 164 (H) 70 - 99 mg/dL    Comment: Glucose reference range applies only to samples taken after fasting for at least 8 hours.  Glucose, capillary     Status: Abnormal   Collection Time: 02/21/22 10:55 PM  Result Value Ref Range   Glucose-Capillary 159 (H) 70 - 99 mg/dL    Comment: Glucose reference range applies only to samples taken after fasting for at least 8 hours.   Glucose, capillary     Status: Abnormal   Collection Time: 02/22/22 12:00 AM  Result Value Ref Range   Glucose-Capillary 162 (H) 70 - 99 mg/dL    Comment: Glucose reference range applies only to samples taken after fasting for at least 8 hours.  Glucose, capillary     Status: Abnormal   Collection Time: 02/22/22  1:08 AM  Result Value Ref Range   Glucose-Capillary 149 (H) 70 - 99 mg/dL    Comment: Glucose reference range applies only to samples taken after fasting for at least 8 hours.  Glucose, capillary     Status: Abnormal   Collection Time: 02/22/22  2:06 AM  Result Value Ref Range   Glucose-Capillary 151 (H) 70 - 99 mg/dL    Comment: Glucose reference range applies only to samples taken after fasting for at least 8 hours.  CBC     Status: Abnormal   Collection Time: 02/22/22  4:15 AM  Result Value Ref Range   WBC 11.2 (H) 4.0 - 10.5 K/uL   RBC 2.60 (L) 3.87 - 5.11 MIL/uL   Hemoglobin 7.9 (L) 12.0 - 15.0 g/dL   HCT 23.6 (L) 36.0 - 46.0 %   MCV 90.8 80.0 - 100.0 fL   MCH 30.4 26.0 - 34.0 pg   MCHC 33.5 30.0 - 36.0 g/dL   RDW 13.5 11.5 - 15.5 %   Platelets 138 (L) 150 - 400 K/uL    Comment: REPEATED TO VERIFY   nRBC 0.0 0.0 - 0.2 %    Comment: Performed at [facility], [place] [address], [place], [place] [address]  Basic metabolic panel  Status: Abnormal   Collection Time: 02/22/22  4:15 AM  Result Value Ref Range   Sodium 138 135 - 145 mmol/L   Potassium 3.9 3.5 - 5.1 mmol/L   Chloride 109 98 - 111 mmol/L   CO2 25 22 - 32 mmol/L   Glucose, Bld 134 (H) 70 - 99 mg/dL    Comment: Glucose reference range applies only to samples taken after fasting for at least 8 hours.   BUN 10 8 - 23 mg/dL   Creatinine, Ser 0.52 0.44 - 1.00 mg/dL   Calcium 7.1 (L) 8.9 - 10.3 mg/dL   GFR, Estimated >60 >60 mL/min    Comment: (NOTE) Calculated using the CKD-EPI Creatinine Equation (2021)    Anion gap 4 (L) 5 - 15    Comment: Performed at [facility] [address],  [place], [place] [address]  Magnesium     Status: None   Collection Time: 02/22/22  4:15 AM  Result Value Ref Range   Magnesium 2.3 1.7 - 2.4 mg/dL    Comment: Performed at [facility] [address], [place], [place] [address]  Cooxemetry Panel (carboxy, met, total hgb, O2 sat)     Status: Abnormal   Collection Time: 02/22/22  4:15 AM  Result Value Ref Range   Total hemoglobin 7.8 (L) 12.0 - 16.0 g/dL   O2 Saturation 70.4 %   Carboxyhemoglobin 0.9 0.5 - 1.5 %   Methemoglobin 0.8 0.0 - 1.5 %    Comment: Performed at [facility], [place] [address], [place], [place] [address]  Glucose, capillary     Status: Abnormal   Collection Time: 02/22/22  4:29 AM  Result Value Ref Range   Glucose-Capillary 123 (H) 70 - 99 mg/dL    Comment: Glucose reference range applies only to samples taken after fasting for at least 8 hours.  Glucose, capillary     Status: Abnormal   Collection Time: 02/22/22  5:36 AM  Result Value Ref Range   Glucose-Capillary 136 (H) 70 - 99 mg/dL    Comment: Glucose reference range applies only to samples taken after fasting for at least 8 hours.  Glucose, capillary     Status: Abnormal   Collection Time: 02/22/22  6:43 AM  Result Value Ref Range   Glucose-Capillary 144 (H) 70 - 99 mg/dL    Comment: Glucose reference range applies only to samples taken after fasting for at least 8 hours.  Glucose, capillary     Status: Abnormal   Collection Time: 02/22/22  7:44 AM  Result Value Ref Range   Glucose-Capillary 30 (LL) 70 - 99 mg/dL    Comment: Glucose reference range applies only to samples taken after fasting for at least 8 hours.   Comment 1 Repeat Test     ECG   Normal sinus rhythm at 77 - Personally Reviewed  Telemetry   Normal sinus rhythm, infrequent PVC's - Personally Reviewed  Radiology    DG Chest Port 1 View  Result Date: 02/22/2022 CLINICAL DATA:  CABG yesterday. EXAM: PORTABLE CHEST 1 VIEW COMPARISON:  Chest x-ray from yesterday. FINDINGS:  Interval removal of the endotracheal and enteric tubes. Unchanged right internal jugular Swan-Ganz catheter and bilateral chest tubes. Stable cardiomediastinal silhouette status post CABG. Continued left greater than right basilar atelectasis. No pneumothorax or large pleural effusion. No acute osseous abnormality. IMPRESSION: 1. Interval extubation. Unchanged bibasilar atelectasis. Electronically Signed   By: Titus Dubin M.D.   On: 02/22/2022 08:14  DG CHEST PORT 1 VIEW  Result Date: 02/21/2022 CLINICAL DATA:  ETT adjustment EXAM: PORTABLE CHEST 1 VIEW COMPARISON:  Same day radiograph FINDINGS: Interval retraction of ET tube, now approximately 3.2 cm above the carina. Enteric tube, right IJ pulmonary arterial catheter, and bilateral chest tubes remain in place. Prior sternotomy and CABG. Stable heart size. Low lung volumes. No pleural effusion or pneumothorax. IMPRESSION: 1. Interval retraction of ET tube, now approximately 3.2 cm above the carina. 2. Otherwise, stable chest. Electronically Signed   By: Davina Poke D.O.   On: 02/21/2022 15:47   DG Chest Port 1 View  Result Date: 02/21/2022 CLINICAL DATA:  Status post CABG. EXAM: PORTABLE CHEST 1 VIEW COMPARISON:  02/18/2022 FINDINGS: Status post CABG. Endotracheal tube tip is just barely above the carina. Swan-Ganz catheter tip is in the proximal right pulmonary artery. Bilateral chest tubes in place without pneumothorax. Gastric decompression tube extends into the stomach. Low lung volumes with bibasilar atelectasis. No overt pulmonary edema or significant pleural effusions. IMPRESSION: Endotracheal tube tip is just barely above the carina. Lungs demonstrate low volumes with bibasilar atelectasis. No pneumothorax identified. Electronically Signed   By: Aletta Edouard M.D.   On: 02/21/2022 14:37   ECHO INTRAOPERATIVE TEE  Result Date: 02/21/2022  *INTRAOPERATIVE TRANSESOPHAGEAL REPORT *  Patient Name:   DARYL QUIROS Date of Exam:  02/21/2022 Medical Rec #:  [id number]        Height:       56.5 in Accession #:    [id number]       Weight:       182.5 lb Date of Birth:  24-Jan-1946       BSA:          1.72 m Patient Age:    52 years         BP:           128/77 mmHg Patient Gender: F                HR:           52 bpm. Exam Location:  Anesthesiology Transesophogeal exam was perform intraoperatively during surgical procedure. Patient was closely monitored under general anesthesia during the entirety of examination. Indications:     Coronary Artery Disease Sonographer:     Bernadene Person RDCS Performing Phys: Jupiter VANTRIGT Diagnosing Phys: Oleta Mouse MD Complications: No known complications during this procedure. POST-OP IMPRESSIONS _ Left Ventricle: has normal systolic function, with an ejection fraction of 65%. The cavity size was normal. The wall motion is normal. _ Right Ventricle: normal function. The cavity was normal. The wall motion is normal. _ Aorta: there is no dissection present in the aorta. _ Aortic Valve: The aortic valve appears unchanged from pre-bypass. _ Mitral Valve: The mitral valve appears unchanged from pre-bypass. _ Tricuspid Valve: The tricuspid valve appears unchanged from pre-bypass. PRE-OP FINDINGS  Left Ventricle: The left ventricle has normal systolic function, with an ejection fraction of 60-65%. The cavity size was normal. No evidence of left ventricular regional wall motion abnormalities. There is borderline concentric left ventricular hypertrophy. Right Ventricle: The right ventricle has normal systolic function. The cavity was normal. There is increased right ventricular wall thickness. Na RWMA. Left Atrium: Left atrial size was normal in size. No left atrial/left atrial appendage thrombus was detected. Left atrial appendage velocity is normal at greater than 40 cm/s. Right Atrium: Right atrial size was normal in size. Interatrial Septum: No atrial level shunt detected by  color flow Doppler. There is  no evidence of a patent foramen ovale. Pericardium: A small pericardial effusion is present. The pericardial effusion is localized near the right ventricle and posterior to the left ventricle. Mitral Valve: The mitral valve is normal in structure. There is mild mitral annular calcification present. Mitral valve regurgitation is moderate by color flow Doppler. The MR jet is centrally-directed. There is No evidence of mitral stenosis. There is moderate thickening and mild calcification present on the mitral valve A2 cusp with normal mobility and there is mild thickening and mild calcification present on the mitral valve posterior cusp with normal mobility. No mitral prolapse. Tricuspid Valve: The tricuspid valve was normal in structure. Tricuspid valve regurgitation is moderate by color flow Doppler. The jet is directed toward the atrial septum. No evidence of tricuspid stenosis is present. PA catheter placement over posterior leaflet hinders copatation likely conributing to severity of TR. Aortic Valve: The aortic valve is tricuspid Aortic valve regurgitation is trivial by color flow Doppler. The jet is centrally-directed. There is mild stenosis of the aortic valve, with a calculated valve area of 1.25 cm. There is mild thickening and mild calcification present on the aortic valve non-coronary, left coronary and right coronary cusps with mildly decreased mobility. Pulmonic Valve: The pulmonic valve was normal in structure, with normal. Pulmonic valve regurgitation is trivial by color flow Doppler. 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 +--------------+--------++ LEFT VENTRICLE         +--------------+--------++ PLAX 2D                +--------------+--------++ LVOT diam:    2.05 cm  +--------------+--------++ LVOT Area:    3.30 cm +--------------+--------++                        +--------------+--------++ +------------------+------------++ AORTIC VALVE                    +------------------+------------++ AV Area (Vmax):   1.38 cm     +------------------+------------++ AV Area (Vmean):  1.53 cm     +------------------+------------++ AV Area (VTI):    1.25 cm     +------------------+------------++ AV Vmax:          206.00 cm/s  +------------------+------------++ AV Vmean:         127.000 cm/s +------------------+------------++ AV VTI:           0.500 m      +------------------+------------++ AV Peak Grad:     17.0 mmHg    +------------------+------------++ AV Mean Grad:     8.0 mmHg     +------------------+------------++ LVOT Vmax:        86.25 cm/s   +------------------+------------++ LVOT Vmean:       59.050 cm/s  +------------------+------------++ LVOT VTI:         0.189 m      +------------------+------------++ LVOT/AV VTI ratio:0.38         +------------------+------------++  +--------------+-------++ AORTA                 +--------------+-------++ Ao Sinus diam:2.30 cm +--------------+-------++ Ao STJ diam:  2.1 cm  +--------------+-------++ +-------------+---------++ MITRAL VALVE             +--------------+-------+ +-------------+---------++   SHUNTS                MV Peak grad:2.4 mmHg    +--------------+-------+ +-------------+---------++   Systemic VTI: 0.19 m  MV Mean grad:1.0 mmHg    +--------------+-------+ +-------------+---------++  Systemic Diam:2.05 cm MV Vmax:     0.77 m/s    +--------------+-------+ +-------------+---------++ MV Vmean:    34.9 cm/s +-------------+---------++ MV VTI:      0.25 m    +-------------+---------++ +-------------+-----------++ MR Peak grad:102.8 mmHg  +-------------+-----------++ MR Mean grad:69.0 mmHg   +-------------+-----------++ MR Vmax:     507.00 cm/s +-------------+-----------++ MR Vmean:    392.0 cm/s  +-------------+-----------++  Oleta Mouse MD Electronically signed by Oleta Mouse MD Signature Date/Time:  02/21/2022/5:19:15 PM    Final     Cardiac Studies   See TEE above  Assessment   Principal Problem:   SEMI (subendocardial myocardial infarction) [facility]) Active Problems:   Unstable angina (HCC)   Chest discomfort   Elevated troponin   History of laparoscopic cholecystectomy   Coronary artery disease   Plan   Doing well POD #1 from 3V CABG - on low dose milrinone. Volume up- on lasix. BP soft. Hemoglobin stable at 7.9. On full dose aspirin, atorvastatin, and metoprolol 12.5 mg BID. LVEF preserved. Continue current therapies.  Time Spent Directly with Patient:  I have spent a total of 25 minutes with the patient reviewing hospital notes, telemetry, EKGs, labs and examining the patient as well as establishing an assessment and plan that was discussed personally with the patient.  > 50% of time was spent in direct patient care.  Length of Stay:  LOS: 4 days   Pixie Casino, MD, [facility], [place] Director of the Advanced Lipid Disorders &  Cardiovascular Risk Reduction Clinic Diplomate of the American Board of Clinical Lipidology Attending Cardiologist  Direct Dial: [phone]  Fax: [phone]  Website:  [email or web address] 02/22/2022, 8:43 AM

## 2022-02-22 NOTE — Progress Notes (Signed)
      [address]       [place] [address]             [phone]      POD # 1 CABG   Sleeping at present  BP (!) 109/59   Pulse 78   Temp 98.9 F (37.2 C) (Oral)   Resp 15   Ht 4' 8.5" (1.435 m)   Wt 91.8 kg   SpO2 92%   BMI 44.57 kg/m  4L [place] 90% sat   Intake/Output Summary (Last 24 hours) at 02/22/2022 1921 Last data filed at 02/22/2022 1800 Gross per 24 hour  Intake 1665.33 ml  Output 1520 ml  Net 145.33 ml   Hypoglycemic this Am, has been 109-160 since then  PM labs pending  Continue current Rx  Kerry Odonohue C. Roxan Hockey, MD [facility] [phone]

## 2022-02-23 ENCOUNTER — Inpatient Hospital Stay (HOSPITAL_COMMUNITY): Payer: PPO

## 2022-02-23 DIAGNOSIS — Z951 Presence of aortocoronary bypass graft: Secondary | ICD-10-CM

## 2022-02-23 LAB — CBC
HCT: 24 % — ABNORMAL LOW (ref 36.0–46.0)
HCT: 28.2 % — ABNORMAL LOW (ref 36.0–46.0)
Hemoglobin: 7.5 g/dL — ABNORMAL LOW (ref 12.0–15.0)
Hemoglobin: 9.5 g/dL — ABNORMAL LOW (ref 12.0–15.0)
MCH: 29.3 pg (ref 26.0–34.0)
MCH: 30.7 pg (ref 26.0–34.0)
MCHC: 31.3 g/dL (ref 30.0–36.0)
MCHC: 33.7 g/dL (ref 30.0–36.0)
MCV: 93.8 fL (ref 80.0–100.0)
MCV: 91.3 fL (ref 80.0–100.0)
Platelets: 123 10*3/uL — ABNORMAL LOW (ref 150–400)
Platelets: 135 10*3/uL — ABNORMAL LOW (ref 150–400)
RBC: 2.56 MIL/uL — ABNORMAL LOW (ref 3.87–5.11)
RBC: 3.09 MIL/uL — ABNORMAL LOW (ref 3.87–5.11)
RDW: 14.1 % (ref 11.5–15.5)
RDW: 14.3 % (ref 11.5–15.5)
WBC: 10.4 10*3/uL (ref 4.0–10.5)
WBC: 12.7 10*3/uL — ABNORMAL HIGH (ref 4.0–10.5)
nRBC: 0 % (ref 0.0–0.2)
nRBC: 0 % (ref 0.0–0.2)

## 2022-02-23 LAB — GLUCOSE, CAPILLARY
Glucose-Capillary: 133 mg/dL — ABNORMAL HIGH (ref 70–99)
Glucose-Capillary: 131 mg/dL — ABNORMAL HIGH (ref 70–99)
Glucose-Capillary: 132 mg/dL — ABNORMAL HIGH (ref 70–99)
Glucose-Capillary: 103 mg/dL — ABNORMAL HIGH (ref 70–99)
Glucose-Capillary: 152 mg/dL — ABNORMAL HIGH (ref 70–99)

## 2022-02-23 LAB — BASIC METABOLIC PANEL
Anion gap: 5 (ref 5–15)
Anion gap: 7 (ref 5–15)
BUN: 19 mg/dL (ref 8–23)
BUN: 18 mg/dL (ref 8–23)
CO2: 27 mmol/L (ref 22–32)
CO2: 25 mmol/L (ref 22–32)
Calcium: 7.7 mg/dL — ABNORMAL LOW (ref 8.9–10.3)
Calcium: 7.4 mg/dL — ABNORMAL LOW (ref 8.9–10.3)
Chloride: 103 mmol/L (ref 98–111)
Chloride: 104 mmol/L (ref 98–111)
Creatinine, Ser: 0.76 mg/dL (ref 0.44–1.00)
Creatinine, Ser: 0.63 mg/dL (ref 0.44–1.00)
GFR, Estimated: >60 mL/min (ref >60)
GFR, Estimated: >60 mL/min (ref >60)
Glucose, Bld: 134 mg/dL — ABNORMAL HIGH (ref 70–99)
Glucose, Bld: 119 mg/dL — ABNORMAL HIGH (ref 70–99)
Potassium: 4 mmol/L (ref 3.5–5.1)
Potassium: 3.7 mmol/L (ref 3.5–5.1)
Sodium: 135 mmol/L (ref 135–145)
Sodium: 136 mmol/L (ref 135–145)

## 2022-02-23 LAB — PREPARE RBC (CROSSMATCH)

## 2022-02-23 LAB — COOXEMETRY PANEL
Carboxyhemoglobin: 1.9 % — ABNORMAL HIGH (ref 0.5–1.5)
Methemoglobin: <0.7 % (ref 0.0–1.5)
O2 Saturation: 55.8 %
Total hemoglobin: 7.6 g/dL — ABNORMAL LOW (ref 12.0–16.0)

## 2022-02-23 MED ORDER — SIMETHICONE 40 MG/0.6ML PO SUSP
40.0000 mg | Freq: Three times a day (TID) | ORAL | Status: DC | PRN
  Filled 2022-02-23: qty 0.6

## 2022-02-23 MED ORDER — FUROSEMIDE 10 MG/ML IJ SOLN
40.0000 mg | Freq: Two times a day (BID) | INTRAMUSCULAR | Status: DC
  Administered 2022-02-23 – 2022-02-24 (×4): 40 mg via INTRAVENOUS
  Filled 2022-02-23 (×4): qty 4

## 2022-02-23 MED ORDER — LOPERAMIDE HCL 2 MG PO CAPS
2.0000 mg | ORAL_CAPSULE | ORAL | Status: DC | PRN
  Administered 2022-02-23: 2 mg via ORAL
  Filled 2022-02-23: qty 1

## 2022-02-23 MED ORDER — SIMETHICONE 80 MG PO CHEW
40.0000 mg | CHEWABLE_TABLET | Freq: Three times a day (TID) | ORAL | Status: DC
  Administered 2022-02-23 – 2022-02-26 (×12): 40 mg via ORAL
  Filled 2022-02-23 (×13): qty 1

## 2022-02-23 MED ORDER — SORBITOL 70 % SOLN
30.0000 mL | Freq: Every day | Status: DC
  Administered 2022-02-23: 30 mL via ORAL
  Filled 2022-02-23: qty 30

## 2022-02-23 MED FILL — Heparin Sodium (Porcine) Inj 1000 Unit/ML: INTRAMUSCULAR | Qty: 10 | Status: AC

## 2022-02-23 MED FILL — Sodium Bicarbonate IV Soln 8.4%: INTRAVENOUS | Qty: 50 | Status: AC

## 2022-02-23 MED FILL — Lidocaine HCl Local Soln Prefilled Syringe 100 MG/5ML (2%): INTRAMUSCULAR | Qty: 5 | Status: AC

## 2022-02-23 MED FILL — Electrolyte-R (PH 7.4) Solution: INTRAVENOUS | Qty: 3000 | Status: AC

## 2022-02-23 MED FILL — Mannitol IV Soln 20%: INTRAVENOUS | Qty: 500 | Status: AC

## 2022-02-23 MED FILL — Sodium Chloride IV Soln 0.9%: INTRAVENOUS | Qty: 2000 | Status: AC

## 2022-02-23 NOTE — Plan of Care (Signed)
  Problem: Education: Goal: Knowledge of General Education information will improve Description: Including pain rating scale, medication(s)/side effects and non-pharmacologic comfort measures Outcome: Progressing   Problem: Clinical Measurements: Goal: Ability to maintain clinical measurements within normal limits will improve Outcome: Progressing Goal: Will remain free from infection Outcome: Progressing Goal: Diagnostic test results will improve Outcome: Progressing Goal: Respiratory complications will improve Outcome: Progressing Goal: Cardiovascular complication will be avoided Outcome: Progressing   Problem: Activity: Goal: Risk for activity intolerance will decrease Outcome: Progressing   Problem: Coping: Goal: Level of anxiety will decrease Outcome: Progressing   Problem: Skin Integrity: Goal: Risk for impaired skin integrity will decrease Outcome: Progressing   Problem: Education: Goal: Knowledge of disease or condition will improve Outcome: Progressing Goal: Knowledge of the prescribed therapeutic regimen will improve Outcome: Progressing   Problem: Activity: Goal: Risk for activity intolerance will decrease Outcome: Progressing   Problem: Cardiac: Goal: Will achieve and/or maintain hemodynamic stability Outcome: Progressing   Problem: Respiratory: Goal: Respiratory status will improve Outcome: Progressing

## 2022-02-23 NOTE — Progress Notes (Addendum)
TCTS DAILY ICU PROGRESS NOTE                   [address]            [place] [address]          [phone]   2 Days Post-Op Procedure(s) (LRB): CORONARY ARTERY BYPASS GRAFTING (CABG) X BYPASSES USING OPEN LEFT INTERNAL MAMMARY ARTERY AND ENDOSCOPIC LEFT GREATER SAPHENOUS VEIN HARVEST. (N/A) TRANSESOPHAGEAL ECHOCARDIOGRAM (TEE) (N/A)  Total Length of Stay:  LOS: 5 days   Subjective: Patient sitting in chair, not much appetite. She feels she has "stomach pain" this am  Objective: Vital signs in last 24 hours: Temp:  [97.6 F (36.4 C)-98.9 F (37.2 C)] 97.9 F (36.6 C) (06/02 0400) Pulse Rate:  [66-82] 75 (06/02 0700) Cardiac Rhythm: Normal sinus rhythm (06/01 1929) Resp:  [0-29] 28 (06/02 0700) BP: (93-137)/(57-76) 137/76 (06/02 0700) SpO2:  [90 %-94 %] 92 % (06/02 0700) Arterial Line BP: (121-123)/(48-50) 123/48 (06/01 0900) Weight:  [91.5 kg] 91.5 kg (06/02 0500)  Filed Weights   02/21/22 0459 02/22/22 0500 02/23/22 0500  Weight: 82.8 kg 91.8 kg 91.5 kg    Weight change: -0.3 kg   Hemodynamic parameters for last 24 hours: PAP: (37-38)/(13-19) 37/13 CO:  [5.2 L/min] 5.2 L/min CI:  [3 L/min/m2] 3 L/min/m2  Intake/Output from previous day: 06/01 0701 - 06/02 0700 In: 803.4 [P.O.:180; I.V.:390.2; IV Piggyback:233.2] Out: 1675 [Urine:1195; Chest Tube:480]  Intake/Output this shift: No intake/output data recorded.  Current Meds: Scheduled Meds:  acetaminophen  1,000 mg Oral Q6H   Or   acetaminophen (TYLENOL) oral liquid 160 mg/5 mL  1,000 mg Per Tube Q6H   aspirin EC  325 mg Oral Daily   Or   aspirin  324 mg Per Tube Daily   atorvastatin  80 mg Oral Daily   bisacodyl  10 mg Oral Daily   Or   bisacodyl  10 mg Rectal Daily   chlorhexidine gluconate (MEDLINE KIT)  15 mL Mouth Rinse BID   Chlorhexidine Gluconate Cloth  6 each Topical Daily   docusate sodium  200 mg Oral Daily   furosemide  20 mg Intravenous BID   insulin aspart  0-24 Units  Subcutaneous Q4H   ketorolac  15 mg Intravenous Q6H   lidocaine  2 patch Transdermal Q24H   mouth rinse  15 mL Mouth Rinse BID   metoCLOPramide (REGLAN) injection  10 mg Intravenous Q6H   metoprolol tartrate  12.5 mg Oral BID   Or   metoprolol tartrate  12.5 mg Per Tube BID   pantoprazole  40 mg Oral Daily   potassium chloride  20 mEq Oral Q4H   sodium chloride flush  3 mL Intravenous Q12H   Continuous Infusions:  sodium chloride Stopped (02/22/22 0913)   sodium chloride     sodium chloride 20 mL/hr at 02/21/22 1415   insulin Stopped (02/22/22 0914)   lactated ringers     lactated ringers Stopped (02/21/22 1415)   lactated ringers 20 mL/hr at 02/22/22 1800   milrinone 0.125 mcg/kg/min (02/23/22 0700)   nitroGLYCERIN Stopped (02/21/22 1415)   norepinephrine (LEVOPHED) Adult infusion Stopped (02/22/22 0624)   phenylephrine (NEO-SYNEPHRINE) Adult infusion Stopped (02/21/22 2048)   PRN Meds:.sodium chloride, dextrose, lactated ringers, metoprolol tartrate, morphine injection, ondansetron (ZOFRAN) IV, oxyCODONE, sodium chloride flush, traMADol  General appearance: alert, cooperative, and no distress Neurologic: intact Heart: RRR Lungs: Diminished bibasilar breath sounds Abdomen: Soft, non tender with palpation, bowel  sounds Extremities: Bilateral LE edema Wound: Aquacel intact. Bilateral LE wounds mostly clean and dry  Lab Results: CBC: Recent Labs    02/22/22 2221 02/23/22 0325  WBC 11.1* 10.4  HGB 7.8* 7.5*  HCT 23.4* 24.0*  PLT 121* 123*   BMET:  Recent Labs    02/22/22 2221 02/23/22 0325  NA 132* 135  K 3.6 4.0  CL 100 103  CO2 26 27  GLUCOSE 194* 134*  BUN 17 19  CREATININE 0.85 0.76  CALCIUM 7.3* 7.7*    CMET: Lab Results  Component Value Date   WBC 10.4 02/23/2022   HGB 7.5 (L) 02/23/2022   HCT 24.0 (L) 02/23/2022   PLT 123 (L) 02/23/2022   GLUCOSE 134 (H) 02/23/2022   CHOL 220 (H) 02/19/2022   TRIG 172 (H) 02/19/2022   HDL 42 02/19/2022    LDLCALC 144 (H) 02/19/2022   ALT 15 02/20/2022   AST 22 02/20/2022   NA 135 02/23/2022   K 4.0 02/23/2022   CL 103 02/23/2022   CREATININE 0.76 02/23/2022   BUN 19 02/23/2022   CO2 27 02/23/2022   TSH 1.171 02/20/2022   INR 1.3 (H) 02/21/2022   HGBA1C 5.6 02/21/2022      PT/INR:  Recent Labs    02/21/22 1438  LABPROT 16.4*  INR 1.3*   Radiology: No results found.   Assessment/Plan: S/P Procedure(s) (LRB): CORONARY ARTERY BYPASS GRAFTING (CABG) X BYPASSES USING OPEN LEFT INTERNAL MAMMARY ARTERY AND ENDOSCOPIC LEFT GREATER SAPHENOUS VEIN HARVEST. (N/A) TRANSESOPHAGEAL ECHOCARDIOGRAM (TEE) (N/A)  CV-SR. On Milrinone drip 0.125 and Lopressor 12.5 mg bid. Co ox this am decreased to 55.8. Pulmonary-on 4 liters of oxygen via [place]. Wean as able over next few days. Chest tubes with 480 cc last 24 hours. CXR this am appears to show bibasilar atelectasis, left pleural effusion, cardiomegaly. Monitor chest tube output this am. Encourage incentive spirometer and flutter valve Volume overload-on Lasix 40 mg IV Expected post op blood loss anemia-H and H this am at 7.5 and 24. Transfuse CBGs 140/130/133. Pre op HGA1C 5.6. Stop accu checks and SS PRN on transfer 6. Mild thrombocytopenia-platelets this am 123,000 7. GI-patient denies nausea and vomiting. Has "stomach pain". Requesting Simethicone so will order. Continue scheduled Reglan 8. Deconditioned-PT consult  Donielle Liston Alba PA-C 02/23/2022 7:23 AM   Leave chest drains for persistent serosanguinous output Caryl Never with transfusion for anemia with lower coox Pt assist for mobilization Stsrt sorbitol for BM  patient examined and medical record reviewed,agree with above note. Dahlia Byes 02/23/2022

## 2022-02-23 NOTE — Discharge Instructions (Signed)

## 2022-02-23 NOTE — Progress Notes (Signed)
Patient ID: Cathy Love, female   DOB: 1946-05-31, 76 y.o.   MRN: [id number]  TCTS Evening Rounds:  Hemodynamically stable in sinus rhythm on milrinone 0.125.  Chest tube output 200 cc today.  Got Sorbitol this am and has had multiple BM's  Ambulated a little.  CBC    Component Value Date/Time   WBC 12.7 (H) 02/23/2022 1701   RBC 3.09 (L) 02/23/2022 1701   HGB 9.5 (L) 02/23/2022 1701   HCT 28.2 (L) 02/23/2022 1701   PLT 135 (L) 02/23/2022 1701   MCV 91.3 02/23/2022 1701   MCH 30.7 02/23/2022 1701   MCHC 33.7 02/23/2022 1701   RDW 14.3 02/23/2022 1701   LYMPHSABS 1.4 02/18/2022 0450   MONOABS 0.5 02/18/2022 0450   EOSABS 0.1 02/18/2022 0450   BASOSABS 0.1 02/18/2022 0450    BMET pending this pm.

## 2022-02-24 ENCOUNTER — Inpatient Hospital Stay (HOSPITAL_COMMUNITY): Payer: PPO

## 2022-02-24 LAB — BPAM RBC
Blood Product Expiration Date: 202306202359
Blood Product Expiration Date: 202306202359
ISSUE DATE / TIME: 202306021004
ISSUE DATE / TIME: 202306030300
Unit Type and Rh: 5100
Unit Type and Rh: 5100

## 2022-02-24 LAB — CBC
HCT: 26.6 % — ABNORMAL LOW (ref 36.0–46.0)
Hemoglobin: 8.9 g/dL — ABNORMAL LOW (ref 12.0–15.0)
MCH: 30.8 pg (ref 26.0–34.0)
MCHC: 33.5 g/dL (ref 30.0–36.0)
MCV: 92 fL (ref 80.0–100.0)
Platelets: 122 10*3/uL — ABNORMAL LOW (ref 150–400)
RBC: 2.89 MIL/uL — ABNORMAL LOW (ref 3.87–5.11)
RDW: 14.5 % (ref 11.5–15.5)
WBC: 10.4 10*3/uL (ref 4.0–10.5)
nRBC: 0.2 % (ref 0.0–0.2)

## 2022-02-24 LAB — TYPE AND SCREEN
ABO/RH(D): O POS
Antibody Screen: NEGATIVE
Unit division: 0
Unit division: 0

## 2022-02-24 LAB — COOXEMETRY PANEL
Carboxyhemoglobin: 1.7 % — ABNORMAL HIGH (ref 0.5–1.5)
Methemoglobin: <0.7 % (ref 0.0–1.5)
O2 Saturation: 69.1 %
Total hemoglobin: 9.2 g/dL — ABNORMAL LOW (ref 12.0–16.0)

## 2022-02-24 LAB — BASIC METABOLIC PANEL
Anion gap: 4 — ABNORMAL LOW (ref 5–15)
BUN: 19 mg/dL (ref 8–23)
CO2: 30 mmol/L (ref 22–32)
Calcium: 7.5 mg/dL — ABNORMAL LOW (ref 8.9–10.3)
Chloride: 103 mmol/L (ref 98–111)
Creatinine, Ser: 0.65 mg/dL (ref 0.44–1.00)
GFR, Estimated: >60 mL/min (ref >60)
Glucose, Bld: 128 mg/dL — ABNORMAL HIGH (ref 70–99)
Potassium: 3.6 mmol/L (ref 3.5–5.1)
Sodium: 137 mmol/L (ref 135–145)

## 2022-02-24 LAB — GLUCOSE, CAPILLARY
Glucose-Capillary: 89 mg/dL (ref 70–99)
Glucose-Capillary: 99 mg/dL (ref 70–99)

## 2022-02-24 LAB — MAGNESIUM: Magnesium: 2 mg/dL (ref 1.7–2.4)

## 2022-02-24 MED ORDER — SODIUM CHLORIDE 0.9 % IV SOLN: 250.0000 mL | INTRAVENOUS | Status: DC | PRN

## 2022-02-24 MED ORDER — ~~LOC~~ CARDIAC SURGERY, PATIENT & FAMILY EDUCATION
Freq: Once | Status: AC
Start: 2022-02-24 — End: 2022-02-24

## 2022-02-24 MED ORDER — POTASSIUM CHLORIDE CRYS ER 20 MEQ PO TBCR
40.0000 meq | EXTENDED_RELEASE_TABLET | Freq: Two times a day (BID) | ORAL | Status: AC
Start: 2022-02-24 — End: 2022-02-24
  Administered 2022-02-24 (×2): 40 meq via ORAL
  Filled 2022-02-24 (×2): qty 2

## 2022-02-24 MED ORDER — ASPIRIN 325 MG PO TBEC
325.0000 mg | DELAYED_RELEASE_TABLET | Freq: Every day | ORAL | Status: DC
  Administered 2022-02-24 – 2022-02-26 (×3): 325 mg via ORAL
  Filled 2022-02-24 (×3): qty 1

## 2022-02-24 MED ORDER — METOPROLOL TARTRATE 12.5 MG HALF TABLET
12.5000 mg | ORAL_TABLET | Freq: Two times a day (BID) | ORAL | Status: DC
  Administered 2022-02-24 – 2022-02-26 (×4): 12.5 mg via ORAL
  Filled 2022-02-24 (×5): qty 1

## 2022-02-24 MED ORDER — SODIUM CHLORIDE 0.9% FLUSH
3.0000 mL | Freq: Two times a day (BID) | INTRAVENOUS | Status: DC
  Administered 2022-02-24 – 2022-02-26 (×4): 3 mL via INTRAVENOUS

## 2022-02-24 MED ORDER — SODIUM CHLORIDE 0.9% FLUSH: 3.0000 mL | INTRAVENOUS | Status: DC | PRN

## 2022-02-24 NOTE — Progress Notes (Signed)
3 Days Post-Op Procedure(s) (LRB): CORONARY ARTERY BYPASS GRAFTING (CABG) X BYPASSES USING OPEN LEFT INTERNAL MAMMARY ARTERY AND ENDOSCOPIC LEFT GREATER SAPHENOUS VEIN HARVEST. (N/A) TRANSESOPHAGEAL ECHOCARDIOGRAM (TEE) (N/A) Subjective: No complaints. Feels well. Ambulating well.  Objective: Vital signs in last 24 hours: Temp:  [97 F (36.1 C)-98.8 F (37.1 C)] 97 F (36.1 C) (06/03 0726) Pulse Rate:  [72-90] 75 (06/03 1000) Cardiac Rhythm: Normal sinus rhythm (06/03 1000) Resp:  [15-32] 17 (06/03 1000) BP: (93-121)/(51-104) 93/76 (06/03 1000) SpO2:  [91 %-97 %] 94 % (06/03 1000) Weight:  [88.7 kg] 88.7 kg (06/03 0500)  Hemodynamic parameters for last 24 hours:    Intake/Output from previous day: 06/02 0701 - 06/03 0700 In: 1650.6 [P.O.:410; I.V.:820.6; Blood:420] Out: 3170 [Urine:2720; Chest Tube:450] Intake/Output this shift: Total I/O In: 440.4 [P.O.:360; I.V.:80.4] Out: 790 [Urine:740; Chest Tube:50]  General appearance: alert and cooperative Neurologic: intact Heart: regular rate and rhythm, S1, S2 normal, no murmur Lungs: clear to auscultation bilaterally Extremities: edema moderate Wound: incision ok  Lab Results: Recent Labs    02/23/22 1701 02/24/22 0446  WBC 12.7* 10.4  HGB 9.5* 8.9*  HCT 28.2* 26.6*  PLT 135* 122*   BMET:  Recent Labs    02/23/22 1701 02/24/22 0446  NA 136 137  K 3.7 3.6  CL 104 103  CO2 25 30  GLUCOSE 119* 128*  BUN 18 19  CREATININE 0.63 0.65  CALCIUM 7.4* 7.5*    PT/INR:  Recent Labs    02/21/22 1438  LABPROT 16.4*  INR 1.3*   ABG    Component Value Date/Time   PHART 7.336 (L) 02/21/2022 2039   HCO3 24.0 02/21/2022 2039   TCO2 25 02/21/2022 2039   ACIDBASEDEF 2.0 02/21/2022 2039   O2SAT 69.1 02/24/2022 0446   CBG (last 3)  Recent Labs    02/23/22 2020 02/24/22 0015 02/24/22 0717  GLUCAP 152* 99 89   CXR: bibasilar atelectasis.  Assessment/Plan: S/P Procedure(s) (LRB): CORONARY ARTERY BYPASS  GRAFTING (CABG) X BYPASSES USING OPEN LEFT INTERNAL MAMMARY ARTERY AND ENDOSCOPIC LEFT GREATER SAPHENOUS VEIN HARVEST. (N/A) TRANSESOPHAGEAL ECHOCARDIOGRAM (TEE) (N/A)  POD 3 Hemodynamically stable in sinus rhythm with Co-ox 69% on milrinone 0.125. Her EF was normal in the OR. DC milrinone.  DC chest tubes, sleeve and foley.  Volume excess: -1500 cc yesterday but still 13 lbs over preop and puffy. Continue diuresis.  Glucose under good control with preop Hgb A1c 5.6 on no meds. DC CBG's  Transfer to 4E and continue mobilization.   LOS: 6 days    Alleen Borne 02/24/2022

## 2022-02-24 NOTE — Evaluation (Signed)
Physical Therapy Evaluation Patient Details Name: Cathy Love MRN: [id number] DOB: 09-04-1946 Today's Date: 02/24/2022  History of Present Illness  The patient is a 76 year old female admitted 5/28 to the ED with jaw and chest pain.   EKG showed subtle lateral T wave inversions in leads I, aVL and no acute ST elevation.  Peak high-sensitivity troponin, and was 584.   She was found to have severe three-vessel coronary artery disease with preserved left ventricular ejection fraction.  There also was  mid anterior wall focal hypokinesis.  Underwent CABG on 5/31.  Clinical Impression  Pt admitted with above diagnosis. Pt was able to ambulate on unit with RW. Occasional cues for sternal precautions and husband present and aware of how to cue pt. Pt should make good progress and go home with HHPT.   Pt currently with functional limitations due to the deficits listed below (see PT Problem List). Pt will benefit from skilled PT to increase their independence and safety with mobility to allow discharge to the venue listed below.          Recommendations for follow up therapy are one component of a multi-disciplinary discharge planning process, led by the attending physician.  Recommendations may be updated based on patient status, additional functional criteria and insurance authorization.  Follow Up Recommendations Home health PT    Assistance Recommended at Discharge Intermittent Supervision/Assistance  Patient can return home with the following  A little help with walking and/or transfers;A little help with bathing/dressing/bathroom;Assistance with cooking/housework;Assist for transportation;Help with stairs or ramp for entrance    Equipment Recommendations Rolling walker (2 wheels)  Recommendations for Other Services       Functional Status Assessment Patient has had a recent decline in their functional status and demonstrates the ability to make significant improvements in function in a  reasonable and predictable amount of time.     Precautions / Restrictions Precautions Precautions: Fall;Sternal Precaution Booklet Issued: Yes (comment) Precaution Comments: chest tube Restrictions RUE Weight Bearing: Non weight bearing LUE Weight Bearing: Non weight bearing Other Position/Activity Restrictions: sternal precautions      Mobility  Bed Mobility               General bed mobility comments: Pt in chair on arrival.    Transfers Overall transfer level: Needs assistance Equipment used: Rolling walker (2 wheels) Transfers: Sit to/from Stand Sit to Stand: Min guard           General transfer comment: cues for hand placement on knees. No physical assit.    Ambulation/Gait Ambulation/Gait assistance: Min guard Gait Distance (Feet): 400 Feet Assistive device: Rolling walker (2 wheels) Gait Pattern/deviations: Decreased stride length, Step-through pattern   Gait velocity interpretation: <1.8 ft/sec, indicate of risk for recurrent falls   General Gait Details: Pt with overall good gait sequencing with RW. Pt without LOB. Needed several standing rest breaks for DOE 3/4.  Stairs            Wheelchair Mobility    Modified Rankin (Stroke Patients Only)       Balance Overall balance assessment: Needs assistance Sitting-balance support: No upper extremity supported, Feet supported Sitting balance-Leahy Scale: Good     Standing balance support: Bilateral upper extremity supported, During functional activity Standing balance-Leahy Scale: Fair Standing balance comment: can stand statically without UE support but needs RW dynamically  Pertinent Vitals/Pain Pain Assessment Pain Assessment: No/denies pain    Home Living Family/patient expects to be discharged to:: Private residence Living Arrangements: Spouse/significant other Available Help at Discharge: Family;Available 24 hours/day Type of Home:  House Home Access: Stairs to enter Entrance Stairs-Rails: None Entrance Stairs-Number of Steps: 1   Home Layout: One level Home Equipment: None      Prior Function Prior Level of Function : Independent/Modified Independent;Driving;Working/employed Therapist, art)                     Hand Dominance   Dominant Hand: Right    Extremity/Trunk Assessment   Upper Extremity Assessment Upper Extremity Assessment: Defer to OT evaluation    Lower Extremity Assessment Lower Extremity Assessment: Overall WFL for tasks assessed    Cervical / Trunk Assessment Cervical / Trunk Assessment: Normal  Communication   Communication: No difficulties  Cognition Arousal/Alertness: Awake/alert Behavior During Therapy: WFL for tasks assessed/performed Overall Cognitive Status: Within Functional Limits for tasks assessed                                          General Comments General comments (skin integrity, edema, etc.): VSS on 4LO2    Exercises     Assessment/Plan    PT Assessment Patient needs continued PT services  PT Problem List Decreased activity tolerance;Decreased balance;Decreased mobility;Decreased knowledge of use of DME;Decreased safety awareness;Decreased knowledge of precautions;Cardiopulmonary status limiting activity;Obesity       PT Treatment Interventions DME instruction;Gait training;Functional mobility training;Stair training;Therapeutic activities;Therapeutic exercise;Balance training;Patient/family education    PT Goals (Current goals can be found in the Care Plan section)  Acute Rehab PT Goals Patient Stated Goal: to go home PT Goal Formulation: With patient Time For Goal Achievement: 03/10/22 Potential to Achieve Goals: Good    Frequency Min 3X/week     Co-evaluation               AM-PAC PT "6 Clicks" Mobility  Outcome Measure Help needed turning from your back to your side while in a flat bed without using  bedrails?: A Lot Help needed moving from lying on your back to sitting on the side of a flat bed without using bedrails?: A Lot Help needed moving to and from a bed to a chair (including a wheelchair)?: A Little Help needed standing up from a chair using your arms (e.g., wheelchair or bedside chair)?: A Little Help needed to walk in hospital room?: A Little Help needed climbing 3-5 steps with a railing? : A Lot 6 Click Score: 15    End of Session Equipment Utilized During Treatment: Gait belt;Oxygen Activity Tolerance: Patient limited by fatigue Patient left: in chair;with call bell/phone within reach;with chair alarm set;with family/visitor present Nurse Communication: Mobility status PT Visit Diagnosis: Muscle weakness (generalized) (M62.81)    Time: [id number] PT Time Calculation (min) (ACUTE ONLY): 30 min   Charges:   PT Evaluation $PT Eval Moderate Complexity: 1 Mod PT Treatments $Gait Training: 8-22 mins        Earl Zellmer M,PT Acute Rehab Services [phone] (pager)   Alvira Philips 02/24/2022, 2:04 PM

## 2022-02-25 LAB — CBC
HCT: 26.6 % — ABNORMAL LOW (ref 36.0–46.0)
Hemoglobin: 9 g/dL — ABNORMAL LOW (ref 12.0–15.0)
MCH: 30.8 pg (ref 26.0–34.0)
MCHC: 33.8 g/dL (ref 30.0–36.0)
MCV: 91.1 fL (ref 80.0–100.0)
Platelets: 169 10*3/uL (ref 150–400)
RBC: 2.92 MIL/uL — ABNORMAL LOW (ref 3.87–5.11)
RDW: 14.4 % (ref 11.5–15.5)
WBC: 8.8 10*3/uL (ref 4.0–10.5)
nRBC: 0 % (ref 0.0–0.2)

## 2022-02-25 LAB — BASIC METABOLIC PANEL
Anion gap: 3 — ABNORMAL LOW (ref 5–15)
BUN: 19 mg/dL (ref 8–23)
CO2: 29 mmol/L (ref 22–32)
Calcium: 8 mg/dL — ABNORMAL LOW (ref 8.9–10.3)
Chloride: 105 mmol/L (ref 98–111)
Creatinine, Ser: 0.75 mg/dL (ref 0.44–1.00)
GFR, Estimated: >60 mL/min (ref >60)
Glucose, Bld: 84 mg/dL (ref 70–99)
Potassium: 4.3 mmol/L (ref 3.5–5.1)
Sodium: 137 mmol/L (ref 135–145)

## 2022-02-25 MED ORDER — POTASSIUM CHLORIDE CRYS ER 20 MEQ PO TBCR
20.0000 meq | EXTENDED_RELEASE_TABLET | Freq: Two times a day (BID) | ORAL | Status: DC
  Administered 2022-02-25 – 2022-02-26 (×3): 20 meq via ORAL
  Filled 2022-02-25 (×3): qty 1

## 2022-02-25 MED ORDER — FUROSEMIDE 40 MG PO TABS
40.0000 mg | ORAL_TABLET | Freq: Two times a day (BID) | ORAL | Status: DC
  Administered 2022-02-25 – 2022-02-26 (×2): 40 mg via ORAL
  Filled 2022-02-25 (×3): qty 1

## 2022-02-25 MED FILL — Magnesium Sulfate Inj 50%: INTRAMUSCULAR | Qty: 10 | Status: AC

## 2022-02-25 MED FILL — Potassium Chloride Inj 2 mEq/ML: INTRAVENOUS | Qty: 40 | Status: AC

## 2022-02-25 MED FILL — Heparin Sodium (Porcine) Inj 1000 Unit/ML: Qty: 1000 | Status: AC

## 2022-02-25 NOTE — Progress Notes (Signed)
Pacing wires removed at 10:54 Ends in tact Pt tolerated well  Instructed to bed rest for 1 hour Bed rest complete

## 2022-02-25 NOTE — Progress Notes (Addendum)
      [address]       [place] [address]             [phone]      4 Days Post-Op Procedure(s) (LRB): CORONARY ARTERY BYPASS GRAFTING (CABG) X BYPASSES USING OPEN LEFT INTERNAL MAMMARY ARTERY AND ENDOSCOPIC LEFT GREATER SAPHENOUS VEIN HARVEST. (N/A) TRANSESOPHAGEAL ECHOCARDIOGRAM (TEE) (N/A) Subjective: Feels she is progressing.  She has been walking around in her room using a rolling walker.  Says pain is controlled with Tylenol alone.  Tolerating cardiac diet and has had a couple bowel movement since surgery.  Objective: Vital signs in last 24 hours: Temp:  [97.7 F (36.5 C)-98.4 F (36.9 C)] 98.3 F (36.8 C) (06/04 0728) Pulse Rate:  [62-89] 86 (06/04 0728) Cardiac Rhythm: Sinus tachycardia (06/03 2345) Resp:  [17-27] 20 (06/04 0728) BP: (90-127)/(52-76) 113/65 (06/04 0728) SpO2:  [90 %-94 %] 93 % (06/04 0728) Weight:  [87.9 kg] 87.9 kg (06/04 0100)  Hemodynamic parameters for last 24 hours:    Intake/Output from previous day: 06/03 0701 - 06/04 0700 In: 620.4 [P.O.:540; I.V.:80.4] Out: 1415 [Urine:1355; Chest Tube:60] Intake/Output this shift: No intake/output data recorded.  General appearance: alert, cooperative, and no distress Neurologic: intact Heart: Regular rate and rhythm, no significant arrhythmias on monitor review Lungs: Breath sounds clear to auscultation Abdomen: Soft, nontender Extremities: No peripheral edema, expected bruising in both lower extremities related to [facility] Wound: Sternotomy incision is well approximated and dry.  Pacer wires remain in place  Lab Results: Recent Labs    02/24/22 0446 02/25/22 0203  WBC 10.4 8.8  HGB 8.9* 9.0*  HCT 26.6* 26.6*  PLT 122* 169   BMET:  Recent Labs    02/24/22 0446 02/25/22 0203  NA 137 137  K 3.6 4.3  CL 103 105  CO2 30 29  GLUCOSE 128* 84  BUN 19 19  CREATININE 0.65 0.75  CALCIUM 7.5* 8.0*    PT/INR: No results for input(s): LABPROT, INR in the last 72 hours. ABG     Component Value Date/Time   PHART 7.336 (L) 02/21/2022 2039   HCO3 24.0 02/21/2022 2039   TCO2 25 02/21/2022 2039   ACIDBASEDEF 2.0 02/21/2022 2039   O2SAT 69.1 02/24/2022 0446   CBG (last 3)  Recent Labs    02/23/22 2020 02/24/22 0015 02/24/22 0717  GLUCAP 152* 99 89    Assessment/Plan: S/P Procedure(s) (LRB): CORONARY ARTERY BYPASS GRAFTING (CABG) X BYPASSES USING OPEN LEFT INTERNAL MAMMARY ARTERY AND ENDOSCOPIC LEFT GREATER SAPHENOUS VEIN HARVEST. (N/A) TRANSESOPHAGEAL ECHOCARDIOGRAM (TEE) (N/A) -Postop day 4 CABG x3 after presenting with subendocardial myocardial infarction with preserved EF.  Progressing well.  Vital signs and cardiac rhythm are stable.  We will remove the epicardial pacer wires today.  Continue aspirin, Lipitor, and metoprolol.  -Volume excess-weight remains about 4 kg positive from preop.  Continue diuresing with Lasix twice daily  -Expected acute blood loss anemia: Tolerating well.  Hematocrit is stable.  Monitor.  -Disposition-anticipate she will be ready for discharge in 1 to 2 days.  PT is recommended home health physical therapy for additional strengthening.  We will also arrange for 3 and 1 bedside commode and rolling walker as requested.   LOS: 7 days    Leary Roca, Cordelia Poche [phone] 02/25/2022   Chart reviewed, patient examined, agree with above. She is progressing well. Possibly home tomorrow.

## 2022-02-26 ENCOUNTER — Inpatient Hospital Stay (HOSPITAL_COMMUNITY): Payer: PPO

## 2022-02-26 LAB — BASIC METABOLIC PANEL
Anion gap: 4 — ABNORMAL LOW (ref 5–15)
BUN: 19 mg/dL (ref 8–23)
CO2: 30 mmol/L (ref 22–32)
Calcium: 8.4 mg/dL — ABNORMAL LOW (ref 8.9–10.3)
Chloride: 104 mmol/L (ref 98–111)
Creatinine, Ser: 0.74 mg/dL (ref 0.44–1.00)
GFR, Estimated: >60 mL/min (ref >60)
Glucose, Bld: 94 mg/dL (ref 70–99)
Potassium: 4.1 mmol/L (ref 3.5–5.1)
Sodium: 138 mmol/L (ref 135–145)

## 2022-02-26 MED ORDER — METOPROLOL TARTRATE 25 MG PO TABS: 12.5000 mg | ORAL_TABLET | Freq: Two times a day (BID) | ORAL | 1 refills | Status: DC

## 2022-02-26 MED ORDER — ASPIRIN 81 MG PO TBEC: 81.0000 mg | DELAYED_RELEASE_TABLET | Freq: Every day | ORAL | Status: DC

## 2022-02-26 MED ORDER — POTASSIUM CHLORIDE CRYS ER 20 MEQ PO TBCR: 20.0000 meq | EXTENDED_RELEASE_TABLET | Freq: Every day | ORAL | 0 refills | Status: DC

## 2022-02-26 MED ORDER — CLOPIDOGREL BISULFATE 75 MG PO TABS: 75.0000 mg | ORAL_TABLET | Freq: Every day | ORAL | 1 refills | Status: DC

## 2022-02-26 MED ORDER — CLOPIDOGREL BISULFATE 75 MG PO TABS
ORAL_TABLET | ORAL | Status: AC
  Filled 2022-02-26: qty 1

## 2022-02-26 MED ORDER — FUROSEMIDE 40 MG PO TABS: 40.0000 mg | ORAL_TABLET | Freq: Every day | ORAL | 0 refills | Status: DC

## 2022-02-26 MED ORDER — CLOPIDOGREL BISULFATE 75 MG PO TABS
75.0000 mg | ORAL_TABLET | Freq: Every day | ORAL | Status: DC
  Administered 2022-02-26: 75 mg via ORAL

## 2022-02-26 MED ORDER — ATORVASTATIN CALCIUM 80 MG PO TABS: 80.0000 mg | ORAL_TABLET | Freq: Every day | ORAL | 1 refills | Status: DC

## 2022-02-26 MED ORDER — TRAMADOL HCL 50 MG PO TABS
50.0000 mg | ORAL_TABLET | Freq: Four times a day (QID) | ORAL | 0 refills | Status: DC | PRN
Start: 2022-02-26 — End: 2022-03-20

## 2022-02-26 NOTE — Care Management Important Message (Signed)
Important Message  Patient Details  Name: Lan Entsminger MRN: [id number] Date of Birth: 1946-01-25   Medicare Important Message Given:  Yes     Renie Ora 02/26/2022, 11:18 AM

## 2022-02-26 NOTE — Progress Notes (Addendum)
      [address]       [place] [address]             [phone]        5 Days Post-Op Procedure(s) (LRB): CORONARY ARTERY BYPASS GRAFTING (CABG) X BYPASSES USING OPEN LEFT INTERNAL MAMMARY ARTERY AND ENDOSCOPIC LEFT GREATER SAPHENOUS VEIN HARVEST. (N/A) TRANSESOPHAGEAL ECHOCARDIOGRAM (TEE) (N/A)  Subjective: Patient walked over 3 times yesterday. She had a soft (not loose) bowel movement.  Objective: Vital signs in last 24 hours: Temp:  [96.9 F (36.1 C)-98.7 F (37.1 C)] 96.9 F (36.1 C) (06/05 0708) Pulse Rate:  [58-86] 80 (06/05 0708) Cardiac Rhythm: Normal sinus rhythm (06/04 1900) Resp:  [15-26] 19 (06/05 0708) BP: (96-141)/(56-85) 111/64 (06/05 0708) SpO2:  [93 %-100 %] 98 % (06/05 0708) Weight:  [86.9 kg] 86.9 kg (06/05 0500)  Pre op weight 82.8 kg Current Weight  02/26/22 86.9 kg      Intake/Output from previous day: 06/04 0701 - 06/05 0700 In: 0  Out: 100 [Urine:100]   Physical Exam:  Cardiovascular: RRR. Pulmonary: Slightly diminished bibasilar breath sounds Abdomen: Soft, non tender, bowel sounds present. Extremities: Mild bilateral lower extremity edema. Ecchymosis right thigh Wounds: Clean and dry.  No erythema or signs of infection.  Lab Results: CBC: Recent Labs    02/24/22 0446 02/25/22 0203  WBC 10.4 8.8  HGB 8.9* 9.0*  HCT 26.6* 26.6*  PLT 122* 169   BMET:  Recent Labs    02/25/22 0203 02/26/22 0247  NA 137 138  K 4.3 4.1  CL 105 104  CO2 29 30  GLUCOSE 84 94  BUN 19 19  CREATININE 0.75 0.74  CALCIUM 8.0* 8.4*    PT/INR:  Lab Results  Component Value Date   INR 1.3 (H) 02/21/2022   INR 1.0 02/19/2022   ABG:  INR: Will add last result for INR, ABG once components are confirmed Will add last 4 CBG results once components are confirmed  Assessment/Plan: CV-SR. On Lopressor 12.5 mg bid. As discussed with Dr. Prescott Gum, start Plavix (had SEMI on admission) and decrease ec asa Pulmonary-on room  air.  CXR this am appears to show bibasilar atelectasis, small left pleural effusion.  Encourage incentive spirometer and flutter valve Volume overload-on Lasix 40 mg PO bid 4. Expected post op blood loss anemia-Last H and H stable at 9 and 26.6 5. Mild thrombocytopenia resolved-platelets this am 169,000 6. Deconditioned-PT consult 7 . Per Dr. Prescott Gum, discharge later today  [facility] 7:22 AM

## 2022-02-26 NOTE — TOC Initial Note (Signed)
Transition of Care (TOC) - Initial/Assessment Note  Donn Pierini RN, BSN Transitions of Care Unit 4E- RN Case Manager See Treatment Team for direct phone #    Patient Details  Name: Cathy Love MRN: [id number] Date of Birth: [birth year]  Transition of Care [facility]) CM/SW Contact:    Darrold Span, RN Phone Number: 02/26/2022, 12:39 PM  Clinical Narrative:                 Orders placed for [facility] and DME needs. CM spoke with pt and daughter at bedside. Discussed transition needs for home. Pt confirmed she needs both RW and 3n1 for home. Agreeable to use in house provider and have delivered to room prior to discharge.  List provided for [facility] choice Per CMS guidelines from medicare.gov website with star ratings (copy placed in shadow chart), after review pt has selected [facility] or [facility] for [facility] needs. Family will be staying with patient post discharge and will transport home.   Address, phone #s and PCP all confirmed.    Call made to [facility] for DME needs- RW and 3n1 to be delivered to room prior to discharge.   Call made to [facility] with [facility] for HHPT need- referral has been accepted.     Expected Discharge Plan: Home w Home Health Services Barriers to Discharge: No Barriers Identified   Patient Goals and CMS Choice Patient states their goals for this hospitalization and ongoing recovery are:: return home CMS Medicare.gov Compare Post Acute Care list provided to:: Patient Choice offered to / list presented to : Patient  Expected Discharge Plan and Services Expected Discharge Plan: Home w Home Health Services   Discharge Planning Services: CM Consult Post Acute Care Choice: Durable Medical Equipment, Home Health Living arrangements for the past 2 months: Single Family Home Expected Discharge Date: 02/26/22               DME Arranged: Cherre Huger rolling DME Agency: [facility] Date DME Agency Contacted: 02/26/22 Time DME Agency Contacted: 1030 Representative spoke with  at DME Agency: Arnold Long HH Arranged: PT HH Agency: [facility] Date [facility] Agency Contacted: 02/26/22 Time HH Agency Contacted: 1130 Representative spoke with at [facility] Agency: Misty Stanley  Prior Living Arrangements/Services Living arrangements for the past 2 months: Single Family Home Lives with:: Self Patient language and need for interpreter reviewed:: Yes        Need for Family Participation in Patient Care: Yes (Comment) Care giver support system in place?: Yes (comment)   Criminal Activity/Legal Involvement Pertinent to Current Situation/Hospitalization: No - Comment as needed  Activities of Daily Living Home Assistive Devices/Equipment: None ADL Screening (condition at time of admission) Patient's cognitive ability adequate to safely complete daily activities?: Yes Is the patient deaf or have difficulty hearing?: No Does the patient have difficulty seeing, even when wearing glasses/contacts?: No Does the patient have difficulty concentrating, remembering, or making decisions?: No Patient able to express need for assistance with ADLs?: Yes Does the patient have difficulty dressing or bathing?: No Independently performs ADLs?: Yes (appropriate for developmental age) Does the patient have difficulty walking or climbing stairs?: No Weakness of Legs: None Weakness of Arms/Hands: None  Permission Sought/Granted Permission sought to share information with : Facility Industrial/product designer granted to share information with : Yes, Verbal Permission Granted     Permission granted to share info w AGENCY: HH/DME        Emotional Assessment Appearance:: Appears stated age Attitude/Demeanor/Rapport: Engaged Affect (typically observed): Accepting, Appropriate Orientation: :  Oriented to Self, Oriented to Place, Oriented to  Time, Oriented to Situation Alcohol / Substance Use: Not Applicable Psych Involvement: No (comment)  Admission diagnosis:  Unstable angina (HCC)  [I20.0] Chest discomfort [R07.89] SEMI (subendocardial myocardial infarction) (HCC) [I21.4] Elevated troponin [R77.8] NSTEMI (non-ST elevated myocardial infarction) (HCC) [I21.4] Coronary artery disease [I25.10] Patient Active Problem List   Diagnosis Date Noted   S/P CABG x 3    Coronary artery disease 02/21/2022   History of laparoscopic cholecystectomy 02/20/2022   Chest discomfort    Elevated troponin    SEMI (subendocardial myocardial infarction) (HCC) 02/18/2022   Unstable angina (HCC) 02/18/2022   PCP:  Mila Palmer, MD Pharmacy:   [facility] [id number] [place], [place] - [address] AT [facility] [address] [facility] [address] [place] [address] Phone: [phone] Fax: [phone]     Social Determinants of Health (SDOH) Interventions    Readmission Risk Interventions    02/26/2022   12:39 PM  Readmission Risk Prevention Plan  Post Dischage Appt Complete  Medication Screening Complete  Transportation Screening Complete

## 2022-02-26 NOTE — Progress Notes (Addendum)
Seen pt from [phone], pt declined ambulation due to walking prior to arrival. Pt was educated on sternal precautions, ex guidelines, encouraged IS use, diet, and [facility]. Pt is being referred to [facility] in [place].   Faustino Congress  02/26/2022 9:25 AM

## 2022-02-28 DIAGNOSIS — Z7982 Long term (current) use of aspirin: Secondary | ICD-10-CM | POA: Diagnosis not present

## 2022-02-28 DIAGNOSIS — Z6841 Body Mass Index (BMI) 40.0 and over, adult: Secondary | ICD-10-CM | POA: Diagnosis not present

## 2022-02-28 DIAGNOSIS — I214 Non-ST elevation (NSTEMI) myocardial infarction: Secondary | ICD-10-CM | POA: Diagnosis not present

## 2022-02-28 DIAGNOSIS — E669 Obesity, unspecified: Secondary | ICD-10-CM | POA: Diagnosis not present

## 2022-02-28 DIAGNOSIS — Z48812 Encounter for surgical aftercare following surgery on the circulatory system: Secondary | ICD-10-CM | POA: Diagnosis not present

## 2022-02-28 DIAGNOSIS — Z7902 Long term (current) use of antithrombotics/antiplatelets: Secondary | ICD-10-CM | POA: Diagnosis not present

## 2022-02-28 DIAGNOSIS — I2510 Atherosclerotic heart disease of native coronary artery without angina pectoris: Secondary | ICD-10-CM | POA: Diagnosis not present

## 2022-02-28 DIAGNOSIS — Z87891 Personal history of nicotine dependence: Secondary | ICD-10-CM | POA: Diagnosis not present

## 2022-03-02 ENCOUNTER — Telehealth (HOSPITAL_COMMUNITY): Payer: Self-pay

## 2022-03-02 ENCOUNTER — Encounter: Payer: Self-pay | Admitting: *Deleted

## 2022-03-02 ENCOUNTER — Encounter (INDEPENDENT_AMBULATORY_CARE_PROVIDER_SITE_OTHER): Payer: Self-pay

## 2022-03-02 ENCOUNTER — Other Ambulatory Visit: Payer: Self-pay | Admitting: Physician Assistant

## 2022-03-02 DIAGNOSIS — Z4802 Encounter for removal of sutures: Secondary | ICD-10-CM

## 2022-03-02 NOTE — Telephone Encounter (Signed)
Attempted to call patient in regards to Cardiac Rehab - LM on VM 

## 2022-03-06 ENCOUNTER — Telehealth: Payer: Self-pay

## 2022-03-06 NOTE — Telephone Encounter (Signed)
FMLA form completed and faxed to [employer] @ [phone]. Beginning leave 02/18/22 through 05/21/22. Pt's husband will pick up forms from front desk.

## 2022-03-07 DIAGNOSIS — D62 Acute posthemorrhagic anemia: Secondary | ICD-10-CM | POA: Diagnosis not present

## 2022-03-07 DIAGNOSIS — I214 Non-ST elevation (NSTEMI) myocardial infarction: Secondary | ICD-10-CM | POA: Diagnosis not present

## 2022-03-07 DIAGNOSIS — I200 Unstable angina: Secondary | ICD-10-CM | POA: Diagnosis not present

## 2022-03-07 DIAGNOSIS — Z951 Presence of aortocoronary bypass graft: Secondary | ICD-10-CM | POA: Diagnosis not present

## 2022-03-07 DIAGNOSIS — I2510 Atherosclerotic heart disease of native coronary artery without angina pectoris: Secondary | ICD-10-CM | POA: Diagnosis not present

## 2022-03-12 NOTE — Progress Notes (Unsigned)
Cardiology Office Note    Date:  03/12/2022   ID:  Cathy Love, DOB Feb 18, 1946, MRN [id number]   PCP:  Mila Palmer, MD   [facility]  Cardiologist:  None *** Advanced Practice Provider:  No care team member to display Electrophysiologist:  None   [id number]}   No chief complaint on file.   History of Present Illness:  Cathy Love is a 76 y.o. 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 She underwent CABG x 3 LIMA-LAD, SVG-Cfx, SVG-PDA 02/21/22. She had normal heart function on echo.    No past medical history on file.  Past Surgical History:  Procedure Laterality Date   CORONARY ARTERY BYPASS GRAFT N/A 02/21/2022   Procedure: CORONARY ARTERY BYPASS GRAFTING (CABG) X BYPASSES USING OPEN LEFT INTERNAL MAMMARY ARTERY AND ENDOSCOPIC LEFT GREATER SAPHENOUS VEIN HARVEST.;  Surgeon: Lovett Sox, MD;  Location: [facility] OR;  Service: Open Heart Surgery;  Laterality: N/A;   LEFT HEART CATH AND CORONARY ANGIOGRAPHY N/A 02/20/2022   Procedure: LEFT HEART CATH AND CORONARY ANGIOGRAPHY;  Surgeon: Lyn Records, MD;  Location: [facility] INVASIVE CV LAB;  Service: Cardiovascular;  Laterality: N/A;   TEE WITHOUT CARDIOVERSION N/A 02/21/2022   Procedure: TRANSESOPHAGEAL ECHOCARDIOGRAM (TEE);  Surgeon: Lovett Sox, MD;  Location: [facility] OR;  Service: Open Heart Surgery;  Laterality: N/A;    Current Medications: No outpatient medications have been marked as taking for the 03/20/22 encounter (Appointment) with Dyann Kief, PA-C.     Allergies:   Patient has no known allergies.   Social History   Socioeconomic History   Marital status: Married    Spouse name: Not on file   Number of children: Not on file   Years of education: Not on file   Highest education level: Not on file  Occupational History   Not on file  Tobacco Use   Smoking status: Never   Smokeless tobacco: Never  Substance and Sexual Activity   Alcohol  use: Not on file   Drug use: Not on file   Sexual activity: Not on file  Other Topics Concern   Not on file  Social History Narrative   Not on file   Social Determinants of Health   Financial Resource Strain: Not on file  Food Insecurity: Not on file  Transportation Needs: Not on file  Physical Activity: Not on file  Stress: Not on file  Social Connections: Not on file     Family History:  The patient's ***family history is not on file.   ROS:   Please see the history of present illness.    ROS All other systems reviewed and are negative.   PHYSICAL EXAM:   VS:  There were no vitals taken for this visit.  Physical Exam  GEN: Well nourished, well developed, in no acute distress  HEENT: normal  Neck: no JVD, carotid bruits, or masses Cardiac:RRR; no murmurs, rubs, or gallops  Respiratory:  clear to auscultation bilaterally, normal work of breathing GI: soft, nontender, nondistended, + BS Ext: without cyanosis, clubbing, or edema, Good distal pulses bilaterally MS: no deformity or atrophy  Skin: warm and dry, no rash Neuro:  Alert and Oriented x 3, Strength and sensation are intact Psych: euthymic mood, full affect  Wt Readings from Last 3 Encounters:  02/26/22 191 lb 9.3 oz (86.9 kg)  06/19/21 195 lb (88.5 kg)      Studies/Labs Reviewed:   EKG:  EKG is*** ordered  today.  The ekg ordered today demonstrates ***  Recent Labs: 02/20/2022: ALT 15; TSH 1.171 02/24/2022: Magnesium 2.0 02/25/2022: Hemoglobin 9.0; Platelets 169 02/26/2022: BUN 19; Creatinine, Ser 0.74; Potassium 4.1; Sodium 138   Lipid Panel    Component Value Date/Time   CHOL 220 (H) 02/19/2022 0150   TRIG 172 (H) 02/19/2022 0150   HDL 42 02/19/2022 0150   CHOLHDL 5.2 02/19/2022 0150   VLDL 34 02/19/2022 0150   LDLCALC 144 (H) 02/19/2022 0150    Additional studies/ records that were reviewed today include:  CARDIAC CATHETERIZATION   Result Date: 02/20/2022 CONCLUSIONS: Right dominant anatomy with  proximal to mid 90% calcified stenosis in RCA. Short left main Proximal LAD 85 to 90% stenosis followed by post stenotic dilatation/aneurysm.  LAD contains diffuse disease in the midsegment up to 80% after the origin of the second diagonal. Circumflex is large gives origin to 4 obtuse marginal branches.  2 branches are very small.  The first branch contains segmental 95% stenosis ostial to proximal.  The distal circumflex contains 80% stenosis before the origin of the large fourth obtuse marginal. Mid anterior wall focal hypokinesis.  Overall EF 55 to 60%.  EDP is normal. RECOMMENDATIONS: Resume IV heparin Aggressive risk factor modification T CTS consultation for CABG.  Will need to remain in hospital as she presented with non-ST elevation MI awakening with spontaneous jaw and chest discomfort which lasted approximately 2 hours.    ECHOCARDIOGRAM COMPLETE   Result Date: 02/18/2022    ECHOCARDIOGRAM REPORT   Patient Name:   Cathy Love Date of Exam: 02/18/2022 Medical Rec #:  [id number]        Height:       56.5 in Accession #:    [id number]       Weight:       183.6 lb Date of Birth:  [birth year]       BSA:          1.723 m Patient Age:    75 years         BP:           134/85 mmHg Patient Gender: F                HR:           60 bpm. Exam Location:  Inpatient Procedure: 2D Echo, Cardiac Doppler, Color Doppler and Strain Analysis Indications:    Chest pain                 MI  History:        Patient has no prior history of Echocardiogram examinations.  Sonographer:    Neomia Dear RDCS Referring Phys: [id number] LAURA R INGOLD  Sonographer Comments: Suboptimal parasternal window, suboptimal apical window and suboptimal subcostal window. IMPRESSIONS  1. Left ventricular ejection fraction, by estimation, is 60 to 65%. The left ventricle has normal function. The left ventricle has no regional wall motion abnormalities. Left ventricular diastolic parameters were normal.  2. Right ventricular systolic function is  normal. The right ventricular size is normal.  3. The mitral valve is normal in structure. No evidence of mitral valve regurgitation. No evidence of mitral stenosis.  4. The aortic valve is tricuspid. There is mild calcification of the aortic valve. There is mild thickening of the aortic valve. Aortic valve regurgitation is not visualized. Aortic valve sclerosis is present, with no evidence of aortic valve stenosis.  5. The inferior vena cava is normal in size with greater than  50% respiratory variability, suggesting right atrial pressure of 3 mmHg. FINDINGS  Left Ventricle: Left ventricular ejection fraction, by estimation, is 60 to 65%. The left ventricle has normal function. The left ventricle has no regional wall motion abnormalities. The left ventricular internal cavity size was normal in size. There is  no left ventricular hypertrophy. Left ventricular diastolic parameters were normal. Right Ventricle: The right ventricular size is normal. No increase in right ventricular wall thickness. Right ventricular systolic function is normal. Left Atrium: Left atrial size was normal in size. Right Atrium: Right atrial size was normal in size. Pericardium: There is no evidence of pericardial effusion. Mitral Valve: The mitral valve is normal in structure. No evidence of mitral valve regurgitation. No evidence of mitral valve stenosis. Tricuspid Valve: The tricuspid valve is normal in structure. Tricuspid valve regurgitation is not demonstrated. No evidence of tricuspid stenosis. Aortic Valve: The aortic valve is tricuspid. There is mild calcification of the aortic valve. There is mild thickening of the aortic valve. Aortic valve regurgitation is not visualized. Aortic valve sclerosis is present, with no evidence of aortic valve stenosis. Aortic valve mean gradient measures 4.0 mmHg. Aortic valve peak gradient measures 7.8 mmHg. Aortic valve area, by VTI measures 1.73 cm. Pulmonic Valve: The pulmonic valve was normal in  structure. Pulmonic valve regurgitation is not visualized. No evidence of pulmonic stenosis. Aorta: The aortic root is normal in size and structure. Venous: The inferior vena cava is normal in size with greater than 50% respiratory variability, suggesting right atrial pressure of 3 mmHg. IAS/Shunts: No atrial level shunt detected by color flow Doppler.  LEFT VENTRICLE PLAX 2D LVIDd:         4.00 cm     Diastology LVIDs:         1.90 cm     LV e' medial:    7.43 cm/s LV PW:         1.30 cm     LV E/e' medial:  5.7 LV IVS:        1.20 cm     LV e' lateral:   6.83 cm/s LVOT diam:     2.10 cm     LV E/e' lateral: 6.3 LV SV:         52 LV SV Index:   30 LVOT Area:     3.46 cm  LV Volumes (MOD) LV vol d, MOD A2C: 41.3 ml LV vol d, MOD A4C: 36.6 ml LV vol s, MOD A2C: 22.7 ml LV vol s, MOD A4C: 19.9 ml LV SV MOD A2C:     18.6 ml LV SV MOD A4C:     36.6 ml LV SV MOD BP:      18.7 ml RIGHT VENTRICLE RV S prime:     9.60 cm/s TAPSE (M-mode): 1.6 cm LEFT ATRIUM             Index LA diam:        3.40 cm 1.97 cm/m LA Vol (A2C):   33.0 ml 19.15 ml/m LA Vol (A4C):   44.9 ml 26.05 ml/m LA Biplane Vol: 39.7 ml 23.04 ml/m  AORTIC VALVE                    PULMONIC VALVE AV Area (Vmax):    1.81 cm     PV Vmax:       0.77 m/s AV Area (Vmean):   1.71 cm     PV Vmean:      55.000  cm/s AV Area (VTI):     1.73 cm     PV VTI:        0.191 m AV Vmax:           140.00 cm/s  PV Peak grad:  2.4 mmHg AV Vmean:          94.300 cm/s  PV Mean grad:  1.0 mmHg AV VTI:            0.301 m AV Peak Grad:      7.8 mmHg AV Mean Grad:      4.0 mmHg LVOT Vmax:         73.20 cm/s LVOT Vmean:        46.600 cm/s LVOT VTI:          0.150 m LVOT/AV VTI ratio: 0.50  AORTA Ao Root diam: 2.40 cm Ao Asc diam:  2.70 cm MITRAL VALVE MV Area (PHT): 2.99 cm    SHUNTS MV Decel Time: 254 msec    Systemic VTI:  0.15 m MV E velocity: 42.70 cm/s  Systemic Diam: 2.10 cm MV A velocity: 70.30 cm/s MV E/A ratio:  0.61 Charlton Haws MD Electronically signed by Charlton Haws  MD Signature Date/Time: 02/18/2022/3:49:03 PM    Final      Risk Assessment/Calculations:   {Does this patient have ATRIAL FIBRILLATION?[phone]}     ASSESSMENT:    No diagnosis found.   PLAN:  In order of problems listed above:  CAD S/P CABG x 3 LIMA-LAD, SVG-Cfx, SVG-PDA 02/21/22, normal LVEF on echo.   Shared Decision Making/Informed Consent   {Are you ordering a CV Procedure (e.g. stress test, cath, DCCV, TEE, etc)?   Press F2        :[id number]}    Medication Adjustments/Labs and Tests Ordered: Current medicines are reviewed at length with the patient today.  Concerns regarding medicines are outlined above.  Medication changes, Labs and Tests ordered today are listed in the Patient Instructions below. There are no Patient Instructions on file for this visit.   Elson Clan, PA-C  03/12/2022 9:35 AM    [facility] [address] [place], [place], [place]  [address] Phone: [phone]; Fax: [phone]

## 2022-03-20 ENCOUNTER — Ambulatory Visit: Payer: PPO | Admitting: Physician Assistant

## 2022-03-20 ENCOUNTER — Other Ambulatory Visit: Payer: Self-pay | Admitting: Cardiothoracic Surgery

## 2022-03-20 ENCOUNTER — Encounter: Payer: Self-pay | Admitting: Physician Assistant

## 2022-03-20 VITALS — BP 124/66 | HR 74 | Ht <= 58 in | Wt 179.0 lb

## 2022-03-20 DIAGNOSIS — Z951 Presence of aortocoronary bypass graft: Secondary | ICD-10-CM

## 2022-03-20 DIAGNOSIS — E785 Hyperlipidemia, unspecified: Secondary | ICD-10-CM

## 2022-03-20 DIAGNOSIS — R5383 Other fatigue: Secondary | ICD-10-CM

## 2022-03-20 DIAGNOSIS — E669 Obesity, unspecified: Secondary | ICD-10-CM | POA: Diagnosis not present

## 2022-03-23 NOTE — Progress Notes (Signed)
      [address]       [place] [address]             [phone]      HPI: Patient returns for routine postoperative follow-up having undergone CABG x 3  on 02/21/2022. The patient's early postoperative recovery while in the hospital progressed without complication.  She was mildly deconditioned and home PT arrangements were made. Since hospital discharge the patient reports ***.   Current Outpatient Medications  Medication Sig Dispense Refill   aspirin EC 81 MG tablet Take 324 mg by mouth once. Swallow whole.     atorvastatin (LIPITOR) 80 MG tablet Take 1 tablet (80 mg total) by mouth daily. 30 tablet 1   clopidogrel (PLAVIX) 75 MG tablet Take 1 tablet (75 mg total) by mouth daily. 30 tablet 1   docusate sodium (COLACE) 100 MG capsule Take 2 capsules by mouth daily.     metoprolol tartrate (LOPRESSOR) 25 MG tablet Take 0.5 tablets (12.5 mg total) by mouth 2 (two) times daily. 30 tablet 1   No current facility-administered medications for this visit.    Physical Exam: ***  Diagnostic Tests: ***  Impression: ***  Plan: ***   Lowella Dandy, PA-C [facility] [phone]

## 2022-03-23 NOTE — Patient Instructions (Signed)

## 2022-03-26 ENCOUNTER — Ambulatory Visit
Admission: RE | Admit: 2022-03-26 | Discharge: 2022-03-26 | Disposition: A | Discharge status: Home / Self Care | Payer: PPO | Source: Ambulatory Visit | Attending: Cardiothoracic Surgery | Admitting: Cardiothoracic Surgery

## 2022-03-26 ENCOUNTER — Ambulatory Visit (INDEPENDENT_AMBULATORY_CARE_PROVIDER_SITE_OTHER): Payer: Self-pay | Admitting: Physician Assistant

## 2022-03-26 VITALS — BP 142/79 | HR 72 | Resp 20 | Ht <= 58 in | Wt 179.1 lb

## 2022-03-26 DIAGNOSIS — Z951 Presence of aortocoronary bypass graft: Secondary | ICD-10-CM

## 2022-03-26 DIAGNOSIS — J90 Pleural effusion, not elsewhere classified: Secondary | ICD-10-CM | POA: Diagnosis not present

## 2022-03-26 DIAGNOSIS — M419 Scoliosis, unspecified: Secondary | ICD-10-CM | POA: Diagnosis not present

## 2022-03-26 DIAGNOSIS — J9811 Atelectasis: Secondary | ICD-10-CM | POA: Diagnosis not present

## 2022-03-26 MED ORDER — FUROSEMIDE 20 MG PO TABS: 20.0000 mg | ORAL_TABLET | Freq: Every day | ORAL | 0 refills | Status: DC

## 2022-03-26 MED ORDER — ASPIRIN 81 MG PO TBEC: 81.0000 mg | DELAYED_RELEASE_TABLET | Freq: Every day | ORAL | Status: DC

## 2022-03-26 MED ORDER — POTASSIUM CHLORIDE ER 10 MEQ PO TBCR: 10.0000 meq | EXTENDED_RELEASE_TABLET | Freq: Every day | ORAL | 0 refills | Status: DC

## 2022-04-02 ENCOUNTER — Other Ambulatory Visit: Payer: Self-pay | Admitting: Thoracic Surgery (Cardiothoracic Vascular Surgery)

## 2022-04-02 ENCOUNTER — Other Ambulatory Visit: Payer: Self-pay | Admitting: Cardiothoracic Surgery

## 2022-04-02 DIAGNOSIS — Z951 Presence of aortocoronary bypass graft: Secondary | ICD-10-CM

## 2022-04-03 ENCOUNTER — Ambulatory Visit
Admission: RE | Admit: 2022-04-03 | Discharge: 2022-04-03 | Disposition: A | Discharge status: Home / Self Care | Payer: PPO | Source: Ambulatory Visit | Attending: Cardiothoracic Surgery | Admitting: Cardiothoracic Surgery

## 2022-04-03 ENCOUNTER — Encounter: Payer: Self-pay | Admitting: Physician Assistant

## 2022-04-03 ENCOUNTER — Ambulatory Visit (INDEPENDENT_AMBULATORY_CARE_PROVIDER_SITE_OTHER): Payer: Self-pay | Admitting: Physician Assistant

## 2022-04-03 VITALS — BP 125/76 | HR 76 | Resp 20 | Ht <= 58 in | Wt 176.0 lb

## 2022-04-03 DIAGNOSIS — J90 Pleural effusion, not elsewhere classified: Secondary | ICD-10-CM | POA: Diagnosis not present

## 2022-04-03 DIAGNOSIS — Z951 Presence of aortocoronary bypass graft: Secondary | ICD-10-CM | POA: Diagnosis not present

## 2022-04-03 DIAGNOSIS — J9811 Atelectasis: Secondary | ICD-10-CM | POA: Diagnosis not present

## 2022-04-03 DIAGNOSIS — Z9889 Other specified postprocedural states: Secondary | ICD-10-CM | POA: Diagnosis not present

## 2022-04-03 NOTE — Progress Notes (Signed)
[address]       [place] [address]             [phone]       Ms. Aunesti Pellegrino is a 76 year old female with a history of coronary disease who presented with unstable angina and ultimately underwent CABG x3 on 01/24/2022.  Postoperative course was uneventful and she was discharged home.  She was seen in follow-up by Ms. Erin Barrett, PA-C on 03/26/2022 and was doing well.  Continuing to progress following her surgery.  However, on chest x-ray she was noted to have a slightly enlarging left pleural effusion.  She has been treated with oral Lasix and potassium over the past 10 days and was asked to return today for follow-up chest x-ray.  Today, she reports she feels much better.  She denies any pain or shortness of breath.  She is gradually increasing her activity and tolerating this well.  She is completing the course of Lasix and potassium as prescribed.   Current Outpatient Medications  Medication Sig Dispense Refill   aspirin EC 81 MG tablet Take 1 tablet (81 mg total) by mouth daily. Swallow whole. 30 tablet    atorvastatin (LIPITOR) 80 MG tablet Take 1 tablet (80 mg total) by mouth daily. 30 tablet 1   clopidogrel (PLAVIX) 75 MG tablet Take 1 tablet (75 mg total) by mouth daily. 30 tablet 1   docusate sodium (COLACE) 100 MG capsule Take 2 capsules by mouth daily.     furosemide (LASIX) 20 MG tablet Take 1 tablet (20 mg total) by mouth daily. 7 tablet 0   metoprolol tartrate (LOPRESSOR) 25 MG tablet Take 0.5 tablets (12.5 mg total) by mouth 2 (two) times daily. 30 tablet 1   potassium chloride (KLOR-CON) 10 MEQ tablet Take 1 tablet (10 mEq total) by mouth daily. 7 tablet 0   No current facility-administered medications for this visit.    Physical Exam Vital signs BP 125/76 Pulse 76 Respirations 20 SPO2 94% on room air  General: Ms. Dionisio appears well today.  She is in good spirits.  She is accompanied by her husband. Heart: Regular rate and rhythm,  no murmur. Chest: Breath sounds are full, equal, and clear to auscultation.  The sternotomy incision is intact and healing with no sign of complication.  The chest x-ray was reviewed and shows no significant pleural effusion.  It does appear her left hemidiaphragm is mildly elevated.  This may be due to her scoliosis. Extremities: All warm and well-perfused.  No peripheral edema.  Diagnostic Tests: CLINICAL DATA:  Status post CABG on 02/21/2022   EXAM: CHEST - 2 VIEW   COMPARISON:  03/26/2022   FINDINGS: Cardiomediastinal silhouette and pulmonary vasculature are within normal limits.   Minimal atelectasis present in the left mid lung. Minimal left pleural effusion also present.   Postsurgical changes of CABG again seen. Dextroconvex curvature of the thoracolumbar spine again identified.   IMPRESSION: Minimal left pleural effusion.     Electronically Signed   By: Acquanetta Belling M.D.   On: 04/03/2022 15:08  Impression / Plan: Ms. Coker continues to make a progressive and satisfactory recovery following coronary bypass grafting.  She is gradually increasing her endurance.  She denies any shortness of breath.  The small left pleural effusion has resolved.  No need for any further diuresis.  We discussed gradually increasing her exercise.  She would like to participate in cardiac rehab but reported there is a  waiting list for her to start. Medications were reviewed and no changes are indicated from surgery standpoint. She may resume driving.  She should continue to observe sternal precautions until 12 weeks post surgery.  Follow-up as needed.   Leary Roca, PA-C [facility] [phone]

## 2022-04-03 NOTE — Patient Instructions (Signed)
You may resume driving.    Continue to observe sternal precautions until 12 weeks post surgery.    Follow-up as needed.

## 2022-04-24 ENCOUNTER — Other Ambulatory Visit: Payer: Self-pay | Admitting: Physician Assistant

## 2022-04-27 ENCOUNTER — Other Ambulatory Visit: Payer: Self-pay | Admitting: Cardiovascular Disease

## 2022-04-27 NOTE — Telephone Encounter (Signed)
Pt's medication was sent to pt's pharmacy as requested. Confirmation received.  °

## 2022-05-21 ENCOUNTER — Ambulatory Visit: Payer: PPO | Attending: Physician Assistant

## 2022-05-21 DIAGNOSIS — Z951 Presence of aortocoronary bypass graft: Secondary | ICD-10-CM

## 2022-05-21 LAB — LIPID PANEL
Chol/HDL Ratio: 3 ratio (ref 0.0–4.4)
Cholesterol, Total: 150 mg/dL (ref 100–199)
HDL: 50 mg/dL (ref >39)
LDL Chol Calc (NIH): 80 mg/dL (ref 0–99)
Triglycerides: 110 mg/dL (ref 0–149)
VLDL Cholesterol Cal: 20 mg/dL (ref 5–40)

## 2022-06-06 ENCOUNTER — Encounter (HOSPITAL_COMMUNITY): Payer: Self-pay

## 2022-06-11 ENCOUNTER — Telehealth (HOSPITAL_COMMUNITY): Payer: Self-pay | Admitting: *Deleted

## 2022-06-11 ENCOUNTER — Telehealth: Payer: Self-pay | Admitting: Physician Assistant

## 2022-06-11 NOTE — Progress Notes (Signed)
Cardiology Office Note    Date:  06/25/2022   ID:  Cathy Love, DOB 13-Aug-1946, MRN [id number]   PCP:  Mila PalmerWolters, Sharon, MD   [facility]  Cardiologist:  Charlton HawsPeter Oletha Tolson, MD    History of Present Illness:  Cathy PancakeRosemarie Love is a 76 y.o. 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 She underwent CABG x 3 LIMA-LAD, SVG-Cfx, SVG-PDA 02/21/22. She had normal heart function on echo. LDL 144.   Patient comes in for f/u. Doing well. No significant pain, dyspnea. LDL 80 on labs 05/21/22 much improved on lipitor 80 mg daily   Belongs to a travel club Doing well stopped asa due to stomach upset and taking plavix   Past Medical History:  Diagnosis Date   Carotid artery occlusion    Coronary artery disease    Hyperlipidemia     Past Surgical History:  Procedure Laterality Date   CARDIAC CATHETERIZATION     CORONARY ARTERY BYPASS GRAFT N/A 02/21/2022   Procedure: CORONARY ARTERY BYPASS GRAFTING (CABG) X BYPASSES USING OPEN LEFT INTERNAL MAMMARY ARTERY AND ENDOSCOPIC LEFT GREATER SAPHENOUS VEIN HARVEST.;  Surgeon: Lovett SoxVantrigt, Kallen Delatorre, MD;  Location: [facility] OR;  Service: Open Heart Surgery;  Laterality: N/A;   LEFT HEART CATH AND CORONARY ANGIOGRAPHY N/A 02/20/2022   Procedure: LEFT HEART CATH AND CORONARY ANGIOGRAPHY;  Surgeon: Lyn RecordsSmith, Henry W, MD;  Location: [facility] INVASIVE CV LAB;  Service: Cardiovascular;  Laterality: N/A;   TEE WITHOUT CARDIOVERSION N/A 02/21/2022   Procedure: TRANSESOPHAGEAL ECHOCARDIOGRAM (TEE);  Surgeon: Lovett SoxVantrigt, Stephan Draughn, MD;  [facility];  Service: Open Heart Surgery;  Laterality: N/A;    Current Medications: Current Meds  Medication Sig   atorvastatin (LIPITOR) 80 MG tablet Take 1 tablet (80 mg total) by mouth daily.   clopidogrel (PLAVIX) 75 MG tablet TAKE 1 TABLET(75 MG) BY MOUTH DAILY   metoprolol tartrate (LOPRESSOR) 25 MG tablet TAKE 1/2 TABLET(12.5 MG) BY MOUTH TWICE DAILY     Allergies:   Patient has no  known allergies.   Social History   Socioeconomic History   Marital status: Married    Spouse name: Not on file   Number of children: Not on file   Years of education: 14   Highest education level: Not on file  Occupational History   Not on file  Tobacco Use   Smoking status: Never   Smokeless tobacco: Never  Substance and Sexual Activity   Alcohol use: Not Currently   Drug use: Never   Sexual activity: Yes  Other Topics Concern   Not on file  Social History Narrative   Not on file   Social Determinants of Health   Financial Resource Strain: Not on file  Food Insecurity: Not on file  Transportation Needs: Not on file  Physical Activity: Not on file  Stress: Not on file  Social Connections: Not on file     Family History:  The patient's  family history is not on file.   ROS:   Please see the history of present illness.    ROS All other systems reviewed and are negative.   PHYSICAL EXAM:   VS:  BP 124/64   Pulse 61   Ht 4\' 9"  (1.448 m)   Wt 178 lb 6.4 oz (80.9 kg)   SpO2 95%   BMI 38.61 kg/m   Physical Exam  Affect appropriate Healthy:  appears stated age HEENT: normal Neck supple with no adenopathy JVP normal no bruits no  thyromegaly Lungs clear with no wheezing and good diaphragmatic motion Heart:  S1/S2 no murmur, no rub, gallop or click PMI normal post sternotomy Abdomen: benighn, BS positve, no tenderness, no AAA no bruit.  No HSM or HJR Distal pulses intact with no bruits No edema Neuro non-focal Left leg greater SVG harvest    Wt Readings from Last 3 Encounters:  06/25/22 178 lb 6.4 oz (80.9 kg)  06/12/22 178 lb 2.1 oz (80.8 kg)  04/03/22 176 lb (79.8 kg)      Studies/Labs Reviewed:   EKG:  SR rate 77 nonspecific ST changes 02/22/22      Recent Labs: 02/20/2022: ALT 15; TSH 1.171 02/24/2022: Magnesium 2.0 02/25/2022: Hemoglobin 9.0; Platelets 169 02/26/2022: BUN 19; Creatinine, Ser 0.74; Potassium 4.1; Sodium 138   Lipid Panel     Component Value Date/Time   CHOL 150 05/21/2022 0835   TRIG 110 05/21/2022 0835   HDL 50 05/21/2022 0835   CHOLHDL 3.0 05/21/2022 0835   CHOLHDL 5.2 02/19/2022 0150   VLDL 34 02/19/2022 0150   LDLCALC 80 05/21/2022 0835    Additional studies/ records that were reviewed today include:  CARDIAC CATHETERIZATION   Result Date: 02/20/2022 CONCLUSIONS: Right dominant anatomy with proximal to mid 90% calcified stenosis in RCA. Short left main Proximal LAD 85 to 90% stenosis followed by post stenotic dilatation/aneurysm.  LAD contains diffuse disease in the midsegment up to 80% after the origin of the second diagonal. Circumflex is large gives origin to 4 obtuse marginal branches.  2 branches are very small.  The first branch contains segmental 95% stenosis ostial to proximal.  The distal circumflex contains 80% stenosis before the origin of the large fourth obtuse marginal. Mid anterior wall focal hypokinesis.  Overall EF 55 to 60%.  EDP is normal. RECOMMENDATIONS: Resume IV heparin Aggressive risk factor modification T CTS consultation for CABG.  Will need to remain in hospital as she presented with non-ST elevation MI awakening with spontaneous jaw and chest discomfort which lasted approximately 2 hours.    ECHOCARDIOGRAM COMPLETE   Result Date: 02/18/2022    ECHOCARDIOGRAM REPORT   Patient Name:   Cathy Love Date of Exam: 02/18/2022 Medical Rec #:  [id number]        Height:       56.5 in Accession #:    [id number]       Weight:       183.6 lb Date of Birth:  Jan 31, 1946       BSA:          1.723 m Patient Age:    75 years         BP:           134/85 mmHg Patient Gender: F                HR:           60 bpm. Exam Location:  Inpatient Procedure: 2D Echo, Cardiac Doppler, Color Doppler and Strain Analysis Indications:    Chest pain                 MI  History:        Patient has no prior history of Echocardiogram examinations.  Sonographer:    Neomia Dear RDCS Referring Phys: [id number] LAURA R INGOLD   Sonographer Comments: Suboptimal parasternal window, suboptimal apical window and suboptimal subcostal window. IMPRESSIONS  1. Left ventricular ejection fraction, by estimation, is 60 to 65%. The left ventricle has normal function. The  left ventricle has no regional wall motion abnormalities. Left ventricular diastolic parameters were normal.  2. Right ventricular systolic function is normal. The right ventricular size is normal.  3. The mitral valve is normal in structure. No evidence of mitral valve regurgitation. No evidence of mitral stenosis.  4. The aortic valve is tricuspid. There is mild calcification of the aortic valve. There is mild thickening of the aortic valve. Aortic valve regurgitation is not visualized. Aortic valve sclerosis is present, with no evidence of aortic valve stenosis.  5. The inferior vena cava is normal in size with greater than 50% respiratory variability, suggesting right atrial pressure of 3 mmHg. FINDINGS  Left Ventricle: Left ventricular ejection fraction, by estimation, is 60 to 65%. The left ventricle has normal function. The left ventricle has no regional wall motion abnormalities. The left ventricular internal cavity size was normal in size. There is  no left ventricular hypertrophy. Left ventricular diastolic parameters were normal. Right Ventricle: The right ventricular size is normal. No increase in right ventricular wall thickness. Right ventricular systolic function is normal. Left Atrium: Left atrial size was normal in size. Right Atrium: Right atrial size was normal in size. Pericardium: There is no evidence of pericardial effusion. Mitral Valve: The mitral valve is normal in structure. No evidence of mitral valve regurgitation. No evidence of mitral valve stenosis. Tricuspid Valve: The tricuspid valve is normal in structure. Tricuspid valve regurgitation is not demonstrated. No evidence of tricuspid stenosis. Aortic Valve: The aortic valve is tricuspid. There is mild  calcification of the aortic valve. There is mild thickening of the aortic valve. Aortic valve regurgitation is not visualized. Aortic valve sclerosis is present, with no evidence of aortic valve stenosis. Aortic valve mean gradient measures 4.0 mmHg. Aortic valve peak gradient measures 7.8 mmHg. Aortic valve area, by VTI measures 1.73 cm. Pulmonic Valve: The pulmonic valve was normal in structure. Pulmonic valve regurgitation is not visualized. No evidence of pulmonic stenosis. Aorta: The aortic root is normal in size and structure. Venous: The inferior vena cava is normal in size with greater than 50% respiratory variability, suggesting right atrial pressure of 3 mmHg. IAS/Shunts: No atrial level shunt detected by color flow Doppler.  LEFT VENTRICLE PLAX 2D LVIDd:         4.00 cm     Diastology LVIDs:         1.90 cm     LV e' medial:    7.43 cm/s LV PW:         1.30 cm     LV E/e' medial:  5.7 LV IVS:        1.20 cm     LV e' lateral:   6.83 cm/s LVOT diam:     2.10 cm     LV E/e' lateral: 6.3 LV SV:         52 LV SV Index:   30 LVOT Area:     3.46 cm  LV Volumes (MOD) LV vol d, MOD A2C: 41.3 ml LV vol d, MOD A4C: 36.6 ml LV vol s, MOD A2C: 22.7 ml LV vol s, MOD A4C: 19.9 ml LV SV MOD A2C:     18.6 ml LV SV MOD A4C:     36.6 ml LV SV MOD BP:      18.7 ml RIGHT VENTRICLE RV S prime:     9.60 cm/s TAPSE (M-mode): 1.6 cm LEFT ATRIUM             Index LA  diam:        3.40 cm 1.97 cm/m LA Vol (A2C):   33.0 ml 19.15 ml/m LA Vol (A4C):   44.9 ml 26.05 ml/m LA Biplane Vol: 39.7 ml 23.04 ml/m  AORTIC VALVE                    PULMONIC VALVE AV Area (Vmax):    1.81 cm     PV Vmax:       0.77 m/s AV Area (Vmean):   1.71 cm     PV Vmean:      55.000 cm/s AV Area (VTI):     1.73 cm     PV VTI:        0.191 m AV Vmax:           140.00 cm/s  PV Peak grad:  2.4 mmHg AV Vmean:          94.300 cm/s  PV Mean grad:  1.0 mmHg AV VTI:            0.301 m AV Peak Grad:      7.8 mmHg AV Mean Grad:      4.0 mmHg LVOT Vmax:          73.20 cm/s LVOT Vmean:        46.600 cm/s LVOT VTI:          0.150 m LVOT/AV VTI ratio: 0.50  AORTA Ao Root diam: 2.40 cm Ao Asc diam:  2.70 cm MITRAL VALVE MV Area (PHT): 2.99 cm    SHUNTS MV Decel Time: 254 msec    Systemic VTI:  0.15 m MV E velocity: 42.70 cm/s  Systemic Diam: 2.10 cm MV A velocity: 70.30 cm/s MV E/A ratio:  0.61 Charlton Haws MD Electronically signed by Charlton Haws MD Signature Date/Time: 02/18/2022/3:49:03 PM    Final      PLAN:  In order of problems listed above:  CAD S/P CABG  02/03/22 by PVT x 3 LIMA-LAD, SVG-Cfx, SVG-PDA 02/21/22, normal LVEF on echo. Feels well. Continue ASA/lipitor, plavix, metoprolol.   HLD LDL improved continue high dose lipitor   Obesity-exercise and weight loss   Medication Adjustments/Labs and Tests Ordered: Current medicines are reviewed at length with the patient today.  Concerns regarding medicines are outlined above.  Medication changes, Labs and Tests ordered today are listed in the Patient Instructions below. There are no Patient Instructions on file for this visit.   F/U in a year   Signed, Charlton Haws, MD  06/25/2022 9:52 AM    [facility] [address] [place], [place], [place]  [address] Phone: [phone]; Fax: [phone]

## 2022-06-11 NOTE — Telephone Encounter (Signed)
Patient sent mychart message via pt schedule. "I received results for a lipid profile only and thought I was having a CBC, ha1c and other blood work.  Should I be scheduled for those tests?"

## 2022-06-11 NOTE — Telephone Encounter (Signed)
Left message to call cardiac rehab.Amoria Mclees Walden Larrell Rapozo RN BSN  

## 2022-06-11 NOTE — Telephone Encounter (Signed)
Completed health history. Confirmed appointment for tomorrow's orientation.Harrell Gave RN BSN

## 2022-06-11 NOTE — Telephone Encounter (Signed)
Spoke with patient to let her know Estella Husk, PA had only ordered the lipid profile. Advised her to follow up with her PCP for other lab work. Patient verbalized understanding.

## 2022-06-12 ENCOUNTER — Encounter (HOSPITAL_COMMUNITY): Payer: Self-pay

## 2022-06-12 ENCOUNTER — Encounter (HOSPITAL_COMMUNITY)
Admission: RE | Admit: 2022-06-12 | Discharge: 2022-06-12 | Disposition: A | Discharge status: Home / Self Care | Payer: PPO | Source: Ambulatory Visit | Attending: Cardiovascular Disease | Admitting: Cardiovascular Disease

## 2022-06-12 VITALS — BP 110/70 | HR 81 | Ht <= 58 in | Wt 178.1 lb

## 2022-06-12 DIAGNOSIS — I214 Non-ST elevation (NSTEMI) myocardial infarction: Secondary | ICD-10-CM | POA: Diagnosis not present

## 2022-06-12 DIAGNOSIS — Z951 Presence of aortocoronary bypass graft: Secondary | ICD-10-CM | POA: Insufficient documentation

## 2022-06-12 HISTORY — DX: Atherosclerotic heart disease of native coronary artery without angina pectoris: I25.10

## 2022-06-12 HISTORY — DX: Hyperlipidemia, unspecified: E78.5

## 2022-06-12 HISTORY — DX: Occlusion and stenosis of unspecified carotid artery: I65.29

## 2022-06-12 NOTE — Progress Notes (Signed)
Patient has no problem obtaining medications; she has good understanding what medications are being taken for.

## 2022-06-12 NOTE — Progress Notes (Signed)
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 Swallow whole. (Patient not taking: Reported on 06/12/2022), Disp: 30 tablet, Rfl:    docusate sodium (COLACE) 100 MG capsule, Take 2 capsules by mouth daily. (Patient not taking: Reported on 06/12/2022), Disp: , Rfl:    furosemide (LASIX) 20 MG tablet, Take 1 tablet (20 mg total) by mouth daily. (Patient not taking: Reported on 04/03/2022), Disp: 7 tablet, Rfl: 0   potassium chloride (KLOR-CON) 10 MEQ tablet, Take 1 tablet (10 mEq total) by mouth daily. (Patient not taking: Reported on 04/03/2022), Disp: 7 tablet, Rfl: 0  Past Medical History: Past Medical History:  Diagnosis Date   Carotid artery occlusion    Coronary artery disease    Hyperlipidemia     Tobacco Use: Social History   Tobacco Use  Smoking Status Never  Smokeless Tobacco Never    Labs: Review Flowsheet  More data exists      Latest Ref Rng & Units 02/21/2022 02/22/2022 02/23/2022 02/24/2022 05/21/2022  Labs for ITP Cardiac and Pulmonary Rehab  Cholestrol 100 -  199 mg/dL - - - - 150   LDL (calc) 0 - 99 mg/dL - - - - 80   HDL-C >39 mg/dL - - - - 50   Trlycerides 0 - 149 mg/dL - - - - 110   Hemoglobin A1c 4.8 - 5.6 % 5.6  - - - -  PH, Arterial 7.35 - 7.45 7.336  7.373  7.399  7.349  7.437  7.414  7.483  7.460  7.431  7.389  - - - -  PCO2 arterial 32 - 48 mmHg 44.8  42.0  41.1  49.8  35.9  45.8  34.2  37.2  41.7  39.2  - - - -  Bicarbonate 20.0 - 28.0 mmol/L 24.0  24.5  25.5  27.9  24.3  29.3  25.7  26.5  27.7  23.7  - - - -  TCO2 22 - 32 mmol/L 25  26  27  29  25  25  31  30  27  27  28  29  25  30  29   - - - -  Acid-base deficit 0.0 - 2.0 mmol/L 2.0  1.0  1.0  - - - -  O2 Saturation % 93  96  99  96  100  100  100  100  100  76  70.4  55.8  69.1  -    Capillary Blood Glucose: Lab Results  Component Value Date   GLUCAP 2  02/24/2022   GLUCAP 99 02/24/2022   GLUCAP 152 (H) 02/23/2022   GLUCAP 103 (H) 02/23/2022   GLUCAP 132 (H) 02/23/2022     Exercise Target Goals: Exercise Program Goal: Individual exercise prescription set using results from initial 6 min walk test and THRR while considering  patient's activity barriers and safety.   Exercise Prescription Goal: Initial exercise prescription builds to 30-45 minutes a day of aerobic activity, 2-3 days per week.  Home exercise guidelines will be given to patient during program as part of exercise prescription that the participant will acknowledge.  Activity Barriers & Risk Stratification:  Activity Barriers & Cardiac Risk Stratification - 06/12/22 0846       Activity Barriers & Cardiac Risk Stratification   Activity Barriers Back Problems    Cardiac Risk Stratification High             6 Minute Walk:  6 Minute Walk     Row Name 06/12/22 0859         6 Minute Walk   Phase Initial     Distance 1119 feet     Walk Time 6 minutes     # of Rest Breaks 3  standing rest breaks     MPH 2.12     METS 1.92     RPE 9     Perceived Dyspnea  1     VO2 Peak 6.73     Symptoms Yes  (comment)     Comments Patient c/o mild SOB, RPD=1, back pain, chronic from scoliosis- "1/10" on the pain scale.     Resting HR 81 bpm     Resting BP 110/70     Resting Oxygen Saturation  92 %  Taken during 1st lap of walk test     Exercise Oxygen Saturation  during 6 min walk 95 %     Max Ex. HR 122 bpm     Max Ex. BP 138/82     2 Minute Post BP 110/72              Oxygen Initial Assessment:   Oxygen Re-Evaluation:   Oxygen Discharge (Final Oxygen Re-Evaluation):   Initial Exercise Prescription:  Initial Exercise Prescription - 06/12/22 0900       Date of Initial Exercise RX and Referring Provider   Date 06/12/22    Referring Provider Josue Hector MD    Expected Discharge Date 08/08/22      Treadmill   MPH 1.7    Grade 0    Minutes 15    METs 2.3      NuStep   Level 1    SPM 85    Minutes 15    METs 2      Prescription Details   Frequency (times per week) 3    Duration Progress to 30 minutes of continuous aerobic without signs/symptoms of physical distress      Intensity   THRR 40-80% of Max Heartrate 58-115    Ratings of Perceived Exertion 11-13    Perceived Dyspnea 0-4      Progression   Progression Continue to progress workloads to maintain intensity without signs/symptoms of physical distress.      Resistance Training   Training Prescription Yes    Weight 2 lbs    Reps 10-15             Perform Capillary Blood Glucose checks as needed.  Exercise Prescription Changes:   Exercise Comments:   Exercise Goals and  Review:   Exercise Goals     Row Name 06/12/22 0831             Exercise Goals   Increase Physical Activity Yes       Intervention Provide advice, education, support and counseling about physical activity/exercise needs.;Develop an individualized exercise prescription for aerobic and resistive training based on initial evaluation findings, risk stratification, comorbidities and participant's personal goals.        Expected Outcomes Short Term: Attend rehab on a regular basis to increase amount of physical activity.;Long Term: Exercising regularly at least 3-5 days a week.;Long Term: Add in home exercise to make exercise part of routine and to increase amount of physical activity.       Increase Strength and Stamina Yes       Intervention Provide advice, education, support and counseling about physical activity/exercise needs.;Develop an individualized exercise prescription for aerobic and resistive training based on initial evaluation findings, risk stratification, comorbidities and participant's personal goals.       Expected Outcomes Short Term: Increase workloads from initial exercise prescription for resistance, speed, and METs.;Short Term: Perform resistance training exercises routinely during rehab and add in resistance training at home;Long Term: Improve cardiorespiratory fitness, muscular endurance and strength as measured by increased METs and functional capacity (6MWT)       Able to understand and use rate of perceived exertion (RPE) scale Yes       Intervention Provide education and explanation on how to use RPE scale       Expected Outcomes Short Term: Able to use RPE daily in rehab to express subjective intensity level;Long Term:  Able to use RPE to guide intensity level when exercising independently       Knowledge and understanding of Target Heart Rate Range (THRR) Yes       Intervention Provide education and explanation of THRR including how the numbers were predicted and where they are located for reference       Expected Outcomes Short Term: Able to state/look up THRR;Long Term: Able to use THRR to govern intensity when exercising independently;Short Term: Able to use daily as guideline for intensity in rehab       Able to check pulse independently Yes       Intervention Provide education and demonstration on how to check pulse in carotid and radial arteries.;Review the importance of being able to  check your own pulse for safety during independent exercise       Expected Outcomes Short Term: Able to explain why pulse checking is important during independent exercise;Long Term: Able to check pulse independently and accurately       Understanding of Exercise Prescription Yes       Intervention Provide education, explanation, and written materials on patient's individual exercise prescription       Expected Outcomes Short Term: Able to explain program exercise prescription;Long Term: Able to explain home exercise prescription to exercise independently                Exercise Goals Re-Evaluation :   Discharge Exercise Prescription (Final Exercise Prescription Changes):   Nutrition:  Target Goals: Understanding of nutrition guidelines, daily intake of sodium 1500mg , cholesterol 200mg , calories 30% from fat and 7% or less from saturated fats, daily to have 5 or more servings of fruits and vegetables.  Biometrics:  Pre Biometrics - 06/12/22 0830       Pre Biometrics   Waist Circumference 46.75 inches    Hip Circumference 47  inches    Waist to Hip Ratio 0.99 %    Triceps Skinfold 21 mm    % Body Fat 48.9 %    Grip Strength 18 kg    Flexibility 13.25 in    Single Leg Stand 11.62 seconds              Nutrition Therapy Plan and Nutrition Goals:   Nutrition Assessments:  MEDIFICTS Score Key: ?70 Need to make dietary changes  40-70 Heart Healthy Diet ? 40 Therapeutic Level Cholesterol Diet    Picture Your Plate Scores: <83 Unhealthy dietary pattern with much room for improvement. 41-50 Dietary pattern unlikely to meet recommendations for good health and room for improvement. 51-60 More healthful dietary pattern, with some room for improvement.  >60 Healthy dietary pattern, although there may be some specific behaviors that could be improved.    Nutrition Goals Re-Evaluation:   Nutrition Goals Re-Evaluation:   Nutrition Goals Discharge (Final Nutrition  Goals Re-Evaluation):   Psychosocial: Target Goals: Acknowledge presence or absence of significant depression and/or stress, maximize coping skills, provide positive support system. Participant is able to verbalize types and ability to use techniques and skills needed for reducing stress and depression.  Initial Review & Psychosocial Screening:  Initial Psych Review & Screening - 06/12/22 0945       Initial Review   Current issues with None Identified      Family Dynamics   Good Support System? Yes    Comments patient has good support in her husband and daughter      Barriers   Psychosocial barriers to participate in program There are no identifiable barriers or psychosocial needs.             Quality of Life Scores:  Quality of Life - 06/12/22 1000       Quality of Life   Select Quality of Life      Quality of Life Scores   Health/Function Pre 28.4 %    Socioeconomic Pre 30 %    Psych/Spiritual Pre 30 %    Family Pre 30 %    GLOBAL Pre 29.29 %            Scores of 19 and below usually indicate a poorer quality of life in these areas.  A difference of  2-3 points is a clinically meaningful difference.  A difference of 2-3 points in the total score of the Quality of Life Index has been associated with significant improvement in overall quality of life, self-image, physical symptoms, and general health in studies assessing change in quality of life.  PHQ-9: Review Flowsheet       06/12/2022  Depression screen PHQ 2/9  Decreased Interest 0  Down, Depressed, Hopeless 0  PHQ - 2 Score 0   Interpretation of Total Score  Total Score Depression Severity:  1-4 = Minimal depression, 5-9 = Mild depression, 10-14 = Moderate depression, 15-19 = Moderately severe depression, 20-27 = Severe depression   Psychosocial Evaluation and Intervention:   Psychosocial Re-Evaluation:   Psychosocial Discharge (Final Psychosocial Re-Evaluation):   Vocational  Rehabilitation: Provide vocational rehab assistance to qualifying candidates.   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 Participant will state understanding/return demonstration of topics presented.     Core Videos: Exercise    Move It!  Clinical staff conducted group or individual video education with verbal and written material and guidebook.  Patient learns the recommended Pritikin exercise program. Exercise with the goal of living a long, healthy life. Some of the health benefits of exercise include controlled diabetes, healthier blood pressure levels, improved cholesterol levels, improved heart and lung capacity, improved sleep, and better body composition. Everyone should speak with their doctor before starting or changing an exercise routine.  Biomechanical Limitations Clinical staff conducted group or individual video education with verbal and written material and guidebook.  Patient learns how biomechanical limitations can impact exercise and how we can mitigate and possibly overcome limitations to have an impactful and balanced exercise routine.  Body Composition Clinical staff conducted group or individual video education with verbal and written material and guidebook.  Patient learns that body composition (ratio of muscle mass to fat mass) is a key component to assessing overall fitness, rather than body weight alone. Increased fat mass, especially visceral belly fat, can put [place] at increased risk for metabolic syndrome, type 2 diabetes, heart disease, and even death. It is recommended to combine diet and  exercise (cardiovascular and resistance training) to improve your body composition. Seek guidance from your physician and exercise physiologist before implementing an exercise routine.  Exercise Action Plan Clinical staff conducted group or individual video education with verbal and written material and guidebook.  Patient learns the recommended strategies to achieve and enjoy long-term exercise adherence, including variety, self-motivation, self-efficacy, and positive decision making. Benefits of exercise include fitness, good health, weight management, more energy, better sleep, less stress, and overall well-being.  Medical   Heart Disease Risk Reduction Clinical staff conducted group or individual video education with verbal and written material and guidebook.  Patient learns our heart is our most vital organ as it circulates oxygen, nutrients, white blood cells, and hormones throughout the entire body, and carries waste away. Data supports a plant-based eating plan like the Pritikin Program for its effectiveness in slowing progression of and reversing heart disease. The video provides a number of recommendations to address heart disease.   Metabolic Syndrome and Belly Fat  Clinical staff conducted group or individual video education with verbal and written material and guidebook.  Patient learns what metabolic syndrome is, how it leads to heart disease, and how one can reverse it and keep it from coming back. You have metabolic syndrome if you have 3 of the following 5 criteria: abdominal obesity, high blood pressure, high triglycerides, low HDL cholesterol, and high blood sugar.  Hypertension and Heart Disease Clinical staff conducted group or individual video education with verbal and written material and guidebook.  Patient learns that high blood pressure, or hypertension, is very common in the [place]. Hypertension is largely due to excessive salt intake, but other important risk  factors include being overweight, physical inactivity, drinking too much alcohol, smoking, and not eating enough potassium from fruits and vegetables. High blood pressure is a leading risk factor for heart attack, stroke, congestive heart failure, dementia, kidney failure, and premature death. Long-term effects of excessive salt intake include stiffening of the arteries and thickening of heart muscle and organ damage. Recommendations include ways to reduce hypertension and the risk of heart disease.  Diseases of Our Time - Focusing on Diabetes Clinical  staff conducted group or individual video education with verbal and written material and guidebook.  Patient learns why the best way to stop diseases of our time is prevention, through food and other lifestyle changes. Medicine (such as prescription pills and surgeries) is often only a Band-Aid on the problem, not a long-term solution. Most common diseases of our time include obesity, type 2 diabetes, hypertension, heart disease, and cancer. The Pritikin Program is recommended and has been proven to help reduce, reverse, and/or prevent the damaging effects of metabolic syndrome.  Nutrition   Overview of the Pritikin Eating Plan  Clinical staff conducted group or individual video education with verbal and written material and guidebook.  Patient learns about the [place] for disease risk reduction. The [place] emphasizes a wide variety of unrefined, minimally-processed carbohydrates, like fruits, vegetables, whole grains, and legumes. Go, Caution, and Stop food choices are explained. Plant-based and lean animal proteins are emphasized. Rationale provided for low sodium intake for blood pressure control, low added sugars for blood sugar stabilization, and low added fats and oils for coronary artery disease risk reduction and weight management.  Calorie Density  Clinical staff conducted group or individual video education with verbal  and written material and guidebook.  Patient learns about calorie density and how it impacts the Pritikin Eating Plan. Knowing the characteristics of the food you choose will help you decide whether those foods will lead to weight gain or weight loss, and whether you want to consume more or less of them. Weight loss is usually a side effect of the Pritikin Eating Plan because of its focus on low calorie-dense foods.  Label Reading  Clinical staff conducted group or individual video education with verbal and written material and guidebook.  Patient learns about the Pritikin recommended label reading guidelines and corresponding recommendations regarding calorie density, added sugars, sodium content, and whole grains.  Dining Out - Part 1  Clinical staff conducted group or individual video education with verbal and written material and guidebook.  Patient learns that restaurant meals can be sabotaging because they can be so high in calories, fat, sodium, and/or sugar. Patient learns recommended strategies on how to positively address this and avoid unhealthy pitfalls.  Facts on Fats  Clinical staff conducted group or individual video education with verbal and written material and guidebook.  Patient learns that lifestyle modifications can be just as effective, if not more so, as many medications for lowering your risk of heart disease. A Pritikin lifestyle can help to reduce your risk of inflammation and atherosclerosis (cholesterol build-up, or plaque, in the artery walls). Lifestyle interventions such as dietary choices and physical activity address the cause of atherosclerosis. A review of the types of fats and their impact on blood cholesterol levels, along with dietary recommendations to reduce fat intake is also included.  Nutrition Action Plan  Clinical staff conducted group or individual video education with verbal and written material and guidebook.  Patient learns how to incorporate Pritikin  recommendations into their lifestyle. Recommendations include planning and keeping personal health goals in mind as an important part of their success.  Healthy Mind-Set    Healthy Minds, Bodies, Hearts  Clinical staff conducted group or individual video education with verbal and written material and guidebook.  Patient learns how to identify when they are stressed. Video will discuss the impact of that stress, as well as the many benefits of stress management. Patient will also be introduced to stress management techniques. The way  we think, act, and feel has an impact on our hearts.  How Our Thoughts Can Heal Our Hearts  Clinical staff conducted group or individual video education with verbal and written material and guidebook.  Patient learns that negative thoughts can cause depression and anxiety. This can result in negative lifestyle behavior and serious health problems. Cognitive behavioral therapy is an effective method to help control our thoughts in order to change and improve our emotional outlook.  Additional Videos:  Exercise    Improving Performance  Clinical staff conducted group or individual video education with verbal and written material and guidebook.  Patient learns to use a non-linear approach by alternating intensity levels and lengths of time spent exercising to help burn more calories and lose more body fat. Cardiovascular exercise helps improve heart health, metabolism, hormonal balance, blood sugar control, and recovery from fatigue. Resistance training improves strength, endurance, balance, coordination, reaction time, metabolism, and muscle mass. Flexibility exercise improves circulation, posture, and balance. Seek guidance from your physician and exercise physiologist before implementing an exercise routine and learn your capabilities and proper form for all exercise.  Introduction to Yoga  Clinical staff conducted group or individual video education with verbal and  written material and guidebook.  Patient learns about yoga, a discipline of the coming together of mind, breath, and body. The benefits of yoga include improved flexibility, improved range of motion, better posture and core strength, increased lung function, weight loss, and positive self-image. Yoga's heart health benefits include lowered blood pressure, healthier heart rate, decreased cholesterol and triglyceride levels, improved immune function, and reduced stress. Seek guidance from your physician and exercise physiologist before implementing an exercise routine and learn your capabilities and proper form for all exercise.  Medical   Aging: Enhancing Your Quality of Life  Clinical staff conducted group or individual video education with verbal and written material and guidebook.  Patient learns key strategies and recommendations to stay in good physical health and enhance quality of life, such as prevention strategies, having an advocate, securing a [place], and keeping a list of medications and system for tracking them. It also discusses how to avoid risk for bone loss.  Biology of Weight Control  Clinical staff conducted group or individual video education with verbal and written material and guidebook.  Patient learns that weight gain occurs because we consume more calories than we burn (eating more, moving less). Even if your body weight is normal, you may have higher ratios of fat compared to muscle mass. Too much body fat puts you at increased risk for cardiovascular disease, heart attack, stroke, type 2 diabetes, and obesity-related cancers. In addition to exercise, following the [place] can help reduce your risk.  Decoding Lab Results  Clinical staff conducted group or individual video education with verbal and written material and guidebook.  Patient learns that lab test reflects one measurement whose values change over time and are influenced  by many factors, including medication, stress, sleep, exercise, food, hydration, pre-existing medical conditions, and more. It is recommended to use the knowledge from this video to become more involved with your lab results and evaluate your numbers to speak with your doctor.   Diseases of Our Time - Overview  Clinical staff conducted group or individual video education with verbal and written material and guidebook.  Patient learns that according to the CDC, 50% to 70% of chronic diseases (such as obesity, type 2 diabetes, elevated lipids, hypertension, and heart  disease) are avoidable through lifestyle improvements including healthier food choices, listening to satiety cues, and increased physical activity.  Sleep Disorders Clinical staff conducted group or individual video education with verbal and written material and guidebook.  Patient learns how good quality and duration of sleep are important to overall health and well-being. Patient also learns about sleep disorders and how they impact health along with recommendations to address them, including discussing with a physician.  Nutrition  Dining Out - Part 2 Clinical staff conducted group or individual video education with verbal and written material and guidebook.  Patient learns how to plan ahead and communicate in order to maximize their dining experience in a healthy and nutritious manner. Included are recommended food choices based on the type of restaurant the patient is visiting.   Fueling a Best boy conducted group or individual video education with verbal and written material and guidebook.  There is a strong connection between our food choices and our health. Diseases like obesity and type 2 diabetes are very prevalent and are in large-part due to lifestyle choices. The Pritikin Eating Plan provides plenty of food and hunger-curbing satisfaction. It is easy to follow, affordable, and helps reduce health  risks.  Menu Workshop  Clinical staff conducted group or individual video education with verbal and written material and guidebook.  Patient learns that restaurant meals can sabotage health goals because they are often packed with calories, fat, sodium, and sugar. Recommendations include strategies to plan ahead and to communicate with the manager, chef, or server to help order a healthier meal.  Planning Your Eating Strategy  Clinical staff conducted group or individual video education with verbal and written material and guidebook.  Patient learns about the [place] and its benefit of reducing the risk of disease. The [place] does not focus on calories. Instead, it emphasizes high-quality, nutrient-rich foods. By knowing the characteristics of the foods, we choose, we can determine their calorie density and make informed decisions.  Targeting Your Nutrition Priorities  Clinical staff conducted group or individual video education with verbal and written material and guidebook.  Patient learns that lifestyle habits have a tremendous impact on disease risk and progression. This video provides eating and physical activity recommendations based on your personal health goals, such as reducing LDL cholesterol, losing weight, preventing or controlling type 2 diabetes, and reducing high blood pressure.  Vitamins and Minerals  Clinical staff conducted group or individual video education with verbal and written material and guidebook.  Patient learns different ways to obtain key vitamins and minerals, including through a recommended healthy diet. It is important to discuss all supplements you take with your doctor.   Healthy Mind-Set    Smoking Cessation  Clinical staff conducted group or individual video education with verbal and written material and guidebook.  Patient learns that cigarette smoking and tobacco addiction pose a serious health risk which affects millions of  people. Stopping smoking will significantly reduce the risk of heart disease, lung disease, and many forms of cancer. Recommended strategies for quitting are covered, including working with your doctor to develop a successful plan.  Culinary   Becoming a Financial trader conducted group or individual video education with verbal and written material and guidebook.  Patient learns that cooking at home can be healthy, cost-effective, quick, and puts them in control. Keys to cooking healthy recipes will include looking at your recipe, assessing your equipment needs, planning ahead, making it simple,  choosing cost-effective seasonal ingredients, and limiting the use of added fats, salts, and sugars.  Cooking - Breakfast and Snacks  Clinical staff conducted group or individual video education with verbal and written material and guidebook.  Patient learns how important breakfast is to satiety and nutrition through the entire day. Recommendations include key foods to eat during breakfast to help stabilize blood sugar levels and to prevent overeating at meals later in the day. Planning ahead is also a key component.  Cooking - Human resources officer conducted group or individual video education with verbal and written material and guidebook.  Patient learns eating strategies to improve overall health, including an approach to cook more at home. Recommendations include thinking of animal protein as a side on your plate rather than center stage and focusing instead on lower calorie dense options like vegetables, fruits, whole grains, and plant-based proteins, such as beans. Making sauces in large quantities to freeze for later and leaving the skin on your vegetables are also recommended to maximize your experience.  Cooking - Healthy Salads and Dressing Clinical staff conducted group or individual video education with verbal and written material and guidebook.  Patient learns that  vegetables, fruits, whole grains, and legumes are the foundations of the [place]. Recommendations include how to incorporate each of these in flavorful and healthy salads, and how to create homemade salad dressings. Proper handling of ingredients is also covered. Cooking - Soups and [employer] - Soups and Desserts Clinical staff conducted group or individual video education with verbal and written material and guidebook.  Patient learns that Pritikin soups and desserts make for easy, nutritious, and delicious snacks and meal components that are low in sodium, fat, sugar, and calorie density, while high in vitamins, minerals, and filling fiber. Recommendations include simple and healthy ideas for soups and desserts.   Overview     The Pritikin Solution Program Overview Clinical staff conducted group or individual video education with verbal and written material and guidebook.  Patient learns that the results of the [place] Program have been documented in more than 100 articles published in peer-reviewed journals, and the benefits include reducing risk factors for (and, in some cases, even reversing) high cholesterol, high blood pressure, type 2 diabetes, obesity, and more! An overview of the three key pillars of the Pritikin Program will be covered: eating well, doing regular exercise, and having a healthy mind-set.  WORKSHOPS  Exercise: Exercise Basics: Building Your Action Plan Clinical staff led group instruction and group discussion with PowerPoint presentation and patient guidebook. To enhance the learning environment the use of posters, models and videos may be added. At the conclusion of this workshop, patients will comprehend the difference between physical activity and exercise, as well as the benefits of incorporating both, into their routine. Patients will understand the FITT (Frequency, Intensity, Time, and Type) principle and how to use it to build an exercise action  plan. In addition, safety concerns and other considerations for exercise and cardiac rehab will be addressed by the presenter. The purpose of this lesson is to promote a comprehensive and effective weekly exercise routine in order to improve patients' overall level of fitness.   Managing Heart Disease: Your Path to a Healthier Heart Clinical staff led group instruction and group discussion with PowerPoint presentation and patient guidebook. To enhance the learning environment the use of posters, models and videos may be added.At the conclusion of this workshop, patients will understand the anatomy and physiology  of the heart. Additionally, they will understand how Pritikin's three pillars impact the risk factors, the progression, and the management of heart disease.  The purpose of this lesson is to provide a high-level overview of the heart, heart disease, and how the Pritikin lifestyle positively impacts risk factors.  Exercise Biomechanics Clinical staff led group instruction and group discussion with PowerPoint presentation and patient guidebook. To enhance the learning environment the use of posters, models and videos may be added. Patients will learn how the structural parts of their bodies function and how these functions impact their daily activities, movement, and exercise. Patients will learn how to promote a neutral spine, learn how to manage pain, and identify ways to improve their physical movement in order to promote healthy living. The purpose of this lesson is to expose patients to common physical limitations that impact physical activity. Participants will learn practical ways to adapt and manage aches and pains, and to minimize their effect on regular exercise. Patients will learn how to maintain good posture while sitting, walking, and lifting.  Balance Training and Fall Prevention  Clinical staff led group instruction and group discussion with PowerPoint presentation and  patient guidebook. To enhance the learning environment the use of posters, models and videos may be added. At the conclusion of this workshop, patients will understand the importance of their sensorimotor skills (vision, proprioception, and the vestibular system) in maintaining their ability to balance as they age. Patients will apply a variety of balancing exercises that are appropriate for their current level of function. Patients will understand the common causes for poor balance, possible solutions to these problems, and ways to modify their physical environment in order to minimize their fall risk. The purpose of this lesson is to teach patients about the importance of maintaining balance as they age and ways to minimize their risk of falling.  WORKSHOPS   Nutrition:  Fueling a Scientist, research (physical sciences) led group instruction and group discussion with PowerPoint presentation and patient guidebook. To enhance the learning environment the use of posters, models and videos may be added. Patients will review the foundational principles of the [place] and understand what constitutes a serving size in each of the food groups. Patients will also learn Pritikin-friendly foods that are better choices when away from home and review make-ahead meal and snack options. Calorie density will be reviewed and applied to three nutrition priorities: weight maintenance, weight loss, and weight gain. The purpose of this lesson is to reinforce (in a group setting) the key concepts around what patients are recommended to eat and how to apply these guidelines when away from home by planning and selecting Pritikin-friendly options. Patients will understand how calorie density may be adjusted for different weight management goals.  Mindful Eating  Clinical staff led group instruction and group discussion with PowerPoint presentation and patient guidebook. To enhance the learning environment the use of posters,  models and videos may be added. Patients will briefly review the concepts of the [place] and the importance of low-calorie dense foods. The concept of mindful eating will be introduced as well as the importance of paying attention to internal hunger signals. Triggers for non-hunger eating and techniques for dealing with triggers will be explored. The purpose of this lesson is to provide patients with the opportunity to review the basic principles of the [place], discuss the value of eating mindfully and how to measure internal cues of hunger and fullness using the Hunger Scale.  Patients will also discuss reasons for non-hunger eating and learn strategies to use for controlling emotional eating.  Targeting Your Nutrition Priorities Clinical staff led group instruction and group discussion with PowerPoint presentation and patient guidebook. To enhance the learning environment the use of posters, models and videos may be added. Patients will learn how to determine their genetic susceptibility to disease by reviewing their family history. Patients will gain insight into the importance of diet as part of an overall healthy lifestyle in mitigating the impact of genetics and other environmental insults. The purpose of this lesson is to provide patients with the opportunity to assess their personal nutrition priorities by looking at their family history, their own health history and current risk factors. Patients will also be able to discuss ways of prioritizing and modifying the [place] for their highest risk areas  Menu  Clinical staff led group instruction and group discussion with PowerPoint presentation and patient guidebook. To enhance the learning environment the use of posters, models and videos may be added. Using menus brought in from [employer], or printed from [employer], patients will apply the [place] dining out guidelines that were presented in the  [employer] video. Patients will also be able to practice these guidelines in a variety of provided scenarios. The purpose of this lesson is to provide patients with the opportunity to practice hands-on learning of the [place] with actual menus and practice scenarios.  Label Reading Clinical staff led group instruction and group discussion with PowerPoint presentation and patient guidebook. To enhance the learning environment the use of posters, models and videos may be added. Patients will review and discuss the Pritikin label reading guidelines presented in Pritikin's Label Reading Educational series video. Using fool labels brought in from local grocery stores and markets, patients will apply the label reading guidelines and determine if the packaged food meet the Pritikin guidelines. The purpose of this lesson is to provide patients with the opportunity to review, discuss, and practice hands-on learning of the Pritikin Label Reading guidelines with actual packaged food labels. [place] Workshops are designed to teach patients ways to prepare quick, simple, and affordable recipes at home. The importance of nutrition's role in chronic disease risk reduction is reflected in its emphasis in the overall Pritikin program. By learning how to prepare essential core Pritikin Eating Plan recipes, patients will increase control over what they eat; be able to customize the flavor of foods without the use of added salt, sugar, or fat; and improve the quality of the food they consume. By learning a set of core recipes which are easily assembled, quickly prepared, and affordable, patients are more likely to prepare more healthy foods at home. These workshops focus on convenient breakfasts, simple entres, side dishes, and desserts which can be prepared with minimal effort and are consistent with nutrition recommendations for cardiovascular risk  reduction. Cooking [employer] are taught by a Engineer, materials (RD) who has been trained by the [employer]. The chef or RD has a clear understanding of the importance of minimizing - if not completely eliminating - added fat, sugar, and sodium in recipes. Throughout the series of [place] Workshop sessions, patients will learn about healthy ingredients and efficient methods of cooking to build confidence in their capability to prepare    Cooking School weekly topics:  Adding Flavor- Sodium-Free  Fast and Healthy Breakfasts  Powerhouse Plant-Based Proteins  Satisfying  Salads and Dressings  Simple Sides and Sauces  International Cuisine-Spotlight on the [place] Zones  Delicious Desserts  Savory Soups  [employer] - Meals in a Snap  Tasty Appetizers and Snacks  Comforting Weekend Breakfasts  One-Pot Wonders   Fast Evening Meals  Easy Entertaining  Personalizing Your Pritikin Plate  WORKSHOPS   Healthy Mindset (Psychosocial): New Thoughts, New Behaviors Clinical staff led group instruction and group discussion with PowerPoint presentation and patient guidebook. To enhance the learning environment the use of posters, models and videos may be added. Patients will learn and practice techniques for developing effective health and lifestyle goals. Patients will be able to effectively apply the goal setting process learned to develop at least one new personal goal.  The purpose of this lesson is to expose patients to a new skill set of behavior modification techniques such as techniques setting SMART goals, overcoming barriers, and achieving new thoughts and new behaviors.  Managing Moods and Relationships Clinical staff led group instruction and group discussion with PowerPoint presentation and patient guidebook. To enhance the learning environment the use of posters, models and videos may be added. Patients will learn how emotional and chronic stress  factors can impact their health and relationships. They will learn healthy ways to manage their moods and utilize positive coping mechanisms. In addition, ICR patients will learn ways to improve communication skills. The purpose of this lesson is to expose patients to ways of understanding how one's mood and health are intimately connected. Developing a healthy outlook can help build positive relationships and connections with others. Patients will understand the importance of utilizing effective communication skills that include actively listening and being heard. They will learn and understand the importance of the "4 Cs" and especially Connections in fostering of a Healthy Mind-Set.  Healthy Sleep for a Healthy Heart Clinical staff led group instruction and group discussion with PowerPoint presentation and patient guidebook. To enhance the learning environment the use of posters, models and videos may be added. At the conclusion of this workshop, patients will be able to demonstrate knowledge of the importance of sleep to overall health, well-being, and quality of life. They will understand the symptoms of, and treatments for, common sleep disorders. Patients will also be able to identify daytime and nighttime behaviors which impact sleep, and they will be able to apply these tools to help manage sleep-related challenges. The purpose of this lesson is to provide patients with a general overview of sleep and outline the importance of quality sleep. Patients will learn about a few of the most common sleep disorders. Patients will also be introduced to the concept of "sleep hygiene," and discover ways to self-manage certain sleeping problems through simple daily behavior changes. Finally, the workshop will motivate patients by clarifying the links between quality sleep and their goals of heart-healthy living.   Recognizing and Reducing Stress Clinical staff led group instruction and group discussion with  PowerPoint presentation and patient guidebook. To enhance the learning environment the use of posters, models and videos may be added. At the conclusion of this workshop, patients will be able to understand the types of stress reactions, differentiate between acute and chronic stress, and recognize the impact that chronic stress has on their health. They will also be able to apply different coping mechanisms, such as reframing negative self-talk. Patients will have the opportunity to practice a variety of stress management techniques, such as deep abdominal breathing, progressive muscle relaxation, and/or guided imagery.  The purpose of this  lesson is to educate patients on the role of stress in their lives and to provide healthy techniques for coping with it.  Learning Barriers/Preferences:  Learning Barriers/Preferences - 06/12/22 1334       Learning Barriers/Preferences   Learning Barriers None    Learning Preferences Verbal Instruction;Written Material             Education Topics:  Knowledge Questionnaire Score:  Knowledge Questionnaire Score - 06/12/22 1000       Knowledge Questionnaire Score   Pre Score 20/24             Core Components/Risk Factors/Patient Goals at Admission:  Personal Goals and Risk Factors at Admission - 06/12/22 0832       Core Components/Risk Factors/Patient Goals on Admission    Weight Management Yes;Obesity    Intervention Weight Management/Obesity: Establish reasonable short term and long term weight goals.;Obesity: Provide education and appropriate resources to help participant work on and attain dietary goals.    Expected Outcomes Short Term: Continue to assess and modify interventions until short term weight is achieved;Long Term: Adherence to nutrition and physical activity/exercise program aimed toward attainment of established weight goal;Weight Loss: Understanding of general recommendations for a balanced deficit meal plan, which promotes  1-2 lb weight loss per week and includes a negative energy balance of [phone] kcal/d    Lipids Yes    Intervention Provide education and support for participant on nutrition & aerobic/resistive exercise along with prescribed medications to achieve LDL 70mg , HDL >40mg .    Expected Outcomes Short Term: Participant states understanding of desired cholesterol values and is compliant with medications prescribed. Participant is following exercise prescription and nutrition guidelines.;Long Term: Cholesterol controlled with medications as prescribed, with individualized exercise RX and with personalized nutrition plan. Value goals: LDL < 70mg , HDL > 40 mg.             Core Components/Risk Factors/Patient Goals Review:    Core Components/Risk Factors/Patient Goals at Discharge (Final Review):    ITP Comments:  ITP Comments     Row Name 06/12/22 0830 06/12/22 0857         ITP Comments Medical Director- Dr. Fransico Him, MD Dr. Fransico Him, Medical Director, Introduction to Pritikin education intensive cardiac rehab program. Initial Pritikin orientation packet reviewed with patient.               Comments: Participant attended orientation for the cardiac rehabilitation program on  06/12/2022  to perform initial intake and exercise walk test. Patient introduced to the [place] education and orientation packet was reviewed. Completed 6-minute walk test, measurements, initial ITP, and exercise prescription. Vital signs stable. Telemetry-normal sinus rhythm, asymptomatic.  Albertine Grates RN 9/19/20231:41 PM  Service time was from 0820 to [phone].

## 2022-06-25 ENCOUNTER — Encounter: Payer: Self-pay | Admitting: Cardiovascular Disease

## 2022-06-25 ENCOUNTER — Ambulatory Visit: Payer: PPO | Attending: Cardiovascular Disease | Admitting: Cardiovascular Disease

## 2022-06-25 VITALS — BP 124/64 | HR 61 | Ht <= 58 in | Wt 178.4 lb

## 2022-06-25 DIAGNOSIS — Z951 Presence of aortocoronary bypass graft: Secondary | ICD-10-CM | POA: Diagnosis not present

## 2022-06-25 NOTE — Patient Instructions (Signed)
Medication Instructions:  Your physician recommends that you continue on your current medications as directed. Please refer to the Current Medication list given to you today.  *If you need a refill on your cardiac medications before your next appointment, please call your pharmacy*  Lab Work: If you have labs (blood work) drawn today and your tests are completely normal, you will receive your results only by: MyChart Message (if you have MyChart) OR A paper copy in the mail If you have any lab test that is abnormal or we need to change your treatment, we will call you to review the results.  Testing/Procedures: None ordered today.  Follow-Up: At [facility], you and your health needs are our priority.  As part of our continuing mission to provide you with exceptional heart care, we have created designated Provider Care Teams.  These Care Teams include your primary Cardiologist (physician) and Advanced Practice Providers (APPs -  Physician Assistants and Nurse Practitioners) who all work together to provide you with the care you need, when you need it.  We recommend signing up for the patient portal called "MyChart".  Sign up information is provided on this After Visit Summary.  MyChart is used to connect with patients for Virtual Visits (Telemedicine).  Patients are able to view lab/test results, encounter notes, upcoming appointments, etc.  Non-urgent messages can be sent to your provider as well.   To learn more about what you can do with MyChart, go to [email or web address].    Your next appointment:   1 year(s)  The format for your next appointment:   In Person  Provider:   Peter Nishan, MD     Important Information About Sugar       

## 2022-06-27 ENCOUNTER — Encounter (HOSPITAL_COMMUNITY): Payer: PPO

## 2022-06-27 DIAGNOSIS — M25572 Pain in left ankle and joints of left foot: Secondary | ICD-10-CM | POA: Diagnosis not present

## 2022-07-02 ENCOUNTER — Telehealth (HOSPITAL_COMMUNITY): Payer: Self-pay | Admitting: *Deleted

## 2022-07-02 ENCOUNTER — Encounter (HOSPITAL_COMMUNITY): Payer: PPO

## 2022-07-02 NOTE — Telephone Encounter (Signed)
Anasofia plans to start exercise on Wednesday as she has been out of town and had a sprained ankle.Barnet Pall, RN,BSN 07/02/2022 4:47 PM

## 2022-07-04 ENCOUNTER — Encounter (HOSPITAL_COMMUNITY)
Admission: RE | Admit: 2022-07-04 | Discharge: 2022-07-04 | Disposition: A | Discharge status: Home / Self Care | Payer: PPO | Source: Ambulatory Visit | Attending: Cardiovascular Disease | Admitting: Cardiovascular Disease

## 2022-07-04 VITALS — BP 102/72 | HR 65 | Wt 178.6 lb

## 2022-07-04 DIAGNOSIS — Z951 Presence of aortocoronary bypass graft: Secondary | ICD-10-CM | POA: Insufficient documentation

## 2022-07-04 DIAGNOSIS — I214 Non-ST elevation (NSTEMI) myocardial infarction: Secondary | ICD-10-CM

## 2022-07-04 NOTE — Progress Notes (Signed)
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  Patient's Home Medications on Admission:  Current Outpatient Medications:    atorvastatin (LIPITOR) 80 MG tablet, Take 1 tablet (80 mg total) by mouth daily., Disp: 30 tablet, Rfl: 1   clopidogrel (PLAVIX) 75 MG tablet, TAKE 1 TABLET(75 MG) BY MOUTH DAILY, Disp: 90 tablet, Rfl: 3   metoprolol tartrate (LOPRESSOR) 25 MG tablet, TAKE 1/2 TABLET(12.5 MG) BY MOUTH TWICE DAILY, Disp: 90 tablet, Rfl: 3  Past Medical History: Past Medical History:  Diagnosis Date   Carotid artery occlusion    Coronary artery disease    Hyperlipidemia     Tobacco Use: Social History   Tobacco Use  Smoking Status Never  Smokeless Tobacco Never    Labs: Review Flowsheet  More data exists      Latest Ref Rng & Units 02/21/2022 02/22/2022 02/23/2022 02/24/2022 05/21/2022  Labs for ITP Cardiac and Pulmonary Rehab  Cholestrol 100 - 199 mg/dL - - - - 409   LDL (calc) 0 - 99 mg/dL - - - - 80   HDL-C >81 mg/dL - - - - 50   Trlycerides 0 - 149 mg/dL - - - - 191   Hemoglobin A1c 4.8 - 5.6 % 5.6  - - - -  PH, Arterial 7.35 - 7.45 7.336  7.373  7.399  7.349  7.437  7.414  7.483  7.460  7.431  7.389  - - - -  PCO2 arterial 32 - 48 mmHg 44.8  42.0  41.1  49.8  35.9  45.8  34.2  37.2  41.7  39.2  - - - -  Bicarbonate 20.0 - 28.0 mmol/L 24.0  24.5  25.5  27.9  24.3  29.3  25.7  26.5  27.7  23.7  - - - -  TCO2 22 - 32 mmol/L - - - -  Acid-base deficit 0.0 - 2.0  mmol/L 2.0  1.0  1.0  - - - -  O2 Saturation % 93  96  99  96  100  100  100  100  100  76  70.4  55.8  69.1  -    Capillary Blood Glucose: Lab Results  Component Value Date   GLUCAP 89 02/24/2022   GLUCAP 99 02/24/2022   GLUCAP 152 (H) 02/23/2022   GLUCAP 103 (H) 02/23/2022   GLUCAP 132 (H) 02/23/2022     Exercise Target Goals: Exercise Program Goal: Individual exercise prescription set using results from initial 6 min walk test and THRR while considering  patient's activity barriers and safety.   Exercise Prescription Goal: Initial exercise prescription builds to 30-45 minutes a day of aerobic activity, 2-3 days per week.  Home exercise guidelines will be given to patient during program as part of exercise prescription that the participant will acknowledge.  Activity Barriers & Risk Stratification:  Activity Barriers &  Cardiac Risk Stratification - 06/12/22 0846       Activity Barriers & Cardiac Risk Stratification   Activity Barriers Back Problems    Cardiac Risk Stratification High             6 Minute Walk:  6 Minute Walk     Row Name 06/12/22 0859         6 Minute Walk   Phase Initial     Distance 1119 feet     Walk Time 6 minutes     # of Rest Breaks 3  standing rest breaks     MPH 2.12     METS 1.92     RPE 9     Perceived Dyspnea  1     VO2 Peak 6.73     Symptoms Yes (comment)     Comments Patient c/o mild SOB, RPD=1, back pain, chronic from scoliosis- "1/10" on the pain scale.     Resting HR 81 bpm     Resting BP 110/70     Resting Oxygen Saturation  92 %  Taken during 1st lap of walk test     Exercise Oxygen Saturation  during 6 min walk 95 %     Max Ex. HR 122 bpm     Max Ex. BP 138/82     2 Minute Post BP 110/72              Oxygen Initial Assessment:   Oxygen Re-Evaluation:   Oxygen Discharge (Final Oxygen Re-Evaluation):   Initial Exercise Prescription:  Initial Exercise Prescription - 06/12/22 0900       Date of Initial  Exercise RX and Referring Provider   Date 06/12/22    Referring Provider Josue Hector MD    Expected Discharge Date 08/08/22      Treadmill   MPH 1.7    Grade 0    Minutes 15    METs 2.3      NuStep   Level 1    SPM 85    Minutes 15    METs 2      Prescription Details   Frequency (times per week) 3    Duration Progress to 30 minutes of continuous aerobic without signs/symptoms of physical distress      Intensity   THRR 40-80% of Max Heartrate 58-115    Ratings of Perceived Exertion 11-13    Perceived Dyspnea 0-4      Progression   Progression Continue to progress workloads to maintain intensity without signs/symptoms of physical distress.      Resistance Training   Training Prescription Yes    Weight 2 lbs    Reps 10-15             Perform Capillary Blood Glucose checks as needed.  Exercise Prescription Changes:   Exercise Prescription Changes     Row Name 07/04/22 1016             Response to Exercise   Blood Pressure (Admit) 102/72       Blood Pressure (Exercise) 138/78       Blood Pressure (Exit) 102/70       Heart Rate (Admit) 65 bpm       Heart Rate (Exercise) 104 bpm       Heart Rate (Exit) 65 bpm       Rating of Perceived Exertion (Exercise) 11       Symptoms None       Comments Off to a  great start with exercise.       Duration Continue with 30 min of aerobic exercise without signs/symptoms of physical distress.       Intensity THRR unchanged         Progression   Progression Continue to progress workloads to maintain intensity without signs/symptoms of physical distress.       Average METs 2.3         Resistance Training   Training Prescription No  Relaxation day, no weights.         Interval Training   Interval Training No         Treadmill   MPH 2       Grade 0       Minutes 15       METs 2.53         NuStep   Level 3       SPM 74       Minutes 15       METs 2.1                Exercise Comments:   Exercise  Comments     Row Name 07/04/22 1142           Exercise Comments Patient tolerated first exercise session well without symptoms. Increased workloads on treadmill and recumbent stepper and tolerated well.                Exercise Goals and Review:   Exercise Goals     Row Name 06/12/22 0831             Exercise Goals   Increase Physical Activity Yes       Intervention Provide advice, education, support and counseling about physical activity/exercise needs.;Develop an individualized exercise prescription for aerobic and resistive training based on initial evaluation findings, risk stratification, comorbidities and participant's personal goals.       Expected Outcomes Short Term: Attend rehab on a regular basis to increase amount of physical activity.;Long Term: Exercising regularly at least 3-5 days a week.;Long Term: Add in home exercise to make exercise part of routine and to increase amount of physical activity.       Increase Strength and Stamina Yes       Intervention Provide advice, education, support and counseling about physical activity/exercise needs.;Develop an individualized exercise prescription for aerobic and resistive training based on initial evaluation findings, risk stratification, comorbidities and participant's personal goals.       Expected Outcomes Short Term: Increase workloads from initial exercise prescription for resistance, speed, and METs.;Short Term: Perform resistance training exercises routinely during rehab and add in resistance training at home;Long Term: Improve cardiorespiratory fitness, muscular endurance and strength as measured by increased METs and functional capacity ( )       Able to understand and use rate of perceived exertion (RPE) scale Yes       Intervention Provide education and explanation on how to use RPE scale       Expected Outcomes Short Term: Able to use RPE daily in rehab to express subjective intensity level;Long Term:  Able to  use RPE to guide intensity level when exercising independently       Knowledge and understanding of Target Heart Rate Range (THRR) Yes       Intervention Provide education and explanation of THRR including how the numbers were predicted and where they are located for reference       Expected Outcomes Short Term: Able to state/look  up THRR;Long Term: Able to use THRR to govern intensity when exercising independently;Short Term: Able to use daily as guideline for intensity in rehab       Able to check pulse independently Yes       Intervention Provide education and demonstration on how to check pulse in carotid and radial arteries.;Review the importance of being able to check your own pulse for safety during independent exercise       Expected Outcomes Short Term: Able to explain why pulse checking is important during independent exercise;Long Term: Able to check pulse independently and accurately       Understanding of Exercise Prescription Yes       Intervention Provide education, explanation, and written materials on patient's individual exercise prescription       Expected Outcomes Short Term: Able to explain program exercise prescription;Long Term: Able to explain home exercise prescription to exercise independently                Exercise Goals Re-Evaluation :  Exercise Goals Re-Evaluation     Row Name 07/04/22 1142             Exercise Goal Re-Evaluation   Exercise Goals Review Increase Physical Activity;Increase Strength and Stamina;Able to understand and use rate of perceived exertion (RPE) scale       Comments Patient able to understand and use RPE scale appropriately.       Expected Outcomes Progress workloads as tolerated to help increase strength and stamina.                Discharge Exercise Prescription (Final Exercise Prescription Changes):  Exercise Prescription Changes - 07/04/22 1016       Response to Exercise   Blood Pressure (Admit) 102/72    Blood  Pressure (Exercise) 138/78    Blood Pressure (Exit) 102/70    Heart Rate (Admit) 65 bpm    Heart Rate (Exercise) 104 bpm    Heart Rate (Exit) 65 bpm    Rating of Perceived Exertion (Exercise) 11    Symptoms None    Comments Off to a great start with exercise.    Duration Continue with 30 min of aerobic exercise without signs/symptoms of physical distress.    Intensity THRR unchanged      Progression   Progression Continue to progress workloads to maintain intensity without signs/symptoms of physical distress.    Average METs 2.3      Resistance Training   Training Prescription No   Relaxation day, no weights.     Interval Training   Interval Training No      Treadmill   MPH 2    Grade 0    Minutes 15    METs 2.53      NuStep   Level 3    SPM 74    Minutes 15    METs 2.1             Nutrition:  Target Goals: Understanding of nutrition guidelines, daily intake of sodium 1500mg , cholesterol 200mg , calories 30% from fat and 7% or less from saturated fats, daily to have 5 or more servings of fruits and vegetables.  Biometrics:  Pre Biometrics - 06/12/22 0830       Pre Biometrics   Waist Circumference 46.75 inches    Hip Circumference 47 inches    Waist to Hip Ratio 0.99 %    Triceps Skinfold 21 mm    % Body Fat 48.9 %    Grip Strength  18 kg    Flexibility 13.25 in    Single Leg Stand 11.62 seconds              Nutrition Therapy Plan and Nutrition Goals:   Nutrition Assessments:  MEDIFICTS Score Key: ?70 Need to make dietary changes  40-70 Heart Healthy Diet ? 40 Therapeutic Level Cholesterol Diet    Picture Your Plate Scores: <14 Unhealthy dietary pattern with much room for improvement. 41-50 Dietary pattern unlikely to meet recommendations for good health and room for improvement. 51-60 More healthful dietary pattern, with some room for improvement.  >60 Healthy dietary pattern, although there may be some specific behaviors that could be  improved.    Nutrition Goals Re-Evaluation:   Nutrition Goals Re-Evaluation:   Nutrition Goals Discharge (Final Nutrition Goals Re-Evaluation):   Psychosocial: Target Goals: Acknowledge presence or absence of significant depression and/or stress, maximize coping skills, provide positive support system. Participant is able to verbalize types and ability to use techniques and skills needed for reducing stress and depression.  Initial Review & Psychosocial Screening:  Initial Psych Review & Screening - 06/12/22 0945       Initial Review   Current issues with None Identified      Family Dynamics   Good Support System? Yes    Comments patient has good support in her husband and daughter      Barriers   Psychosocial barriers to participate in program There are no identifiable barriers or psychosocial needs.             Quality of Life Scores:  Quality of Life - 06/12/22 1000       Quality of Life   Select Quality of Life      Quality of Life Scores   Health/Function Pre 28.4 %    Socioeconomic Pre 30 %    Psych/Spiritual Pre 30 %    Family Pre 30 %    GLOBAL Pre 29.29 %            Scores of 19 and below usually indicate a poorer quality of life in these areas.  A difference of  2-3 points is a clinically meaningful difference.  A difference of 2-3 points in the total score of the Quality of Life Index has been associated with significant improvement in overall quality of life, self-image, physical symptoms, and general health in studies assessing change in quality of life.  PHQ-9: Review Flowsheet       06/12/2022  Depression screen PHQ 2/9  Decreased Interest 0  Down, Depressed, Hopeless 0  PHQ - 2 Score 0   Interpretation of Total Score  Total Score Depression Severity:  1-4 = Minimal depression, 5-9 = Mild depression, 10-14 = Moderate depression, 15-19 = Moderately severe depression, 20-27 = Severe depression   Psychosocial Evaluation and  Intervention:   Psychosocial Re-Evaluation:  Psychosocial Re-Evaluation     Row Name 07/04/22 1113             Psychosocial Re-Evaluation   Current issues with None Identified       Interventions Encouraged to attend Cardiac Rehabilitation for the exercise       Continue Psychosocial Services  No Follow up required                Psychosocial Discharge (Final Psychosocial Re-Evaluation):  Psychosocial Re-Evaluation - 07/04/22 1113       Psychosocial Re-Evaluation   Current issues with None Identified    Interventions Encouraged to attend Cardiac  Rehabilitation for the exercise    Continue Psychosocial Services  No Follow up required             Vocational Rehabilitation: Provide vocational rehab assistance to qualifying candidates.   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 Participant will state understanding/return demonstration of topics presented.     Core Videos: Exercise    Move It!  Clinical staff conducted group or individual video education with verbal and written material and guidebook.  Patient learns the recommended Pritikin exercise program. Exercise with the goal of living a long, healthy life. Some of the health benefits of exercise include controlled diabetes, healthier blood pressure levels, improved cholesterol levels, improved heart and lung capacity, improved sleep, and better body composition. Everyone should speak with their doctor before starting or changing an exercise routine.  Biomechanical Limitations Clinical staff conducted group or individual video  education with verbal and written material and guidebook.  Patient learns how biomechanical limitations can impact exercise and how we can mitigate and possibly overcome limitations to have an impactful and balanced exercise routine.  Body Composition Clinical staff conducted group or individual video education with verbal and written material and guidebook.  Patient learns that body composition (ratio of muscle mass to fat mass) is a key component to assessing overall fitness, rather than body weight alone. Increased fat mass, especially visceral belly fat, can put [place] at increased risk for metabolic syndrome, type 2 diabetes, heart disease, and even death. It is recommended to combine diet and exercise (cardiovascular and resistance training) to improve your body composition. Seek guidance from your physician and exercise physiologist before implementing an exercise routine.  Exercise Action Plan Clinical staff conducted group or individual video education with verbal and written material and guidebook.  Patient learns the recommended strategies to achieve and enjoy long-term exercise adherence, including variety, self-motivation, self-efficacy, and positive decision making. Benefits of exercise include fitness, good health, weight management, more energy, better sleep, less stress, and overall well-being.  Medical   Heart Disease Risk Reduction Clinical staff conducted group or individual video education with verbal and written material and guidebook.  Patient learns our heart is our most vital organ as it circulates oxygen, nutrients, white blood cells, and hormones throughout the entire body, and carries waste away. Data supports a plant-based eating plan like the Pritikin Program for its effectiveness in slowing progression of and reversing heart disease. The video provides a number of recommendations to address heart disease.   Metabolic Syndrome and Belly Fat  Clinical staff conducted group  or individual video education with verbal and written material and guidebook.  Patient learns what metabolic syndrome is, how it leads to heart disease, and how one can reverse it and keep it from coming back. You have metabolic syndrome if you have 3 of the following 5 criteria: abdominal obesity, high blood pressure, high triglycerides, low HDL cholesterol, and high blood sugar.  Hypertension and Heart Disease Clinical staff conducted group or individual video education with verbal and written material and guidebook.  Patient learns that high blood pressure, or hypertension, is  very common in the [place]. Hypertension is largely due to excessive salt intake, but other important risk factors include being overweight, physical inactivity, drinking too much alcohol, smoking, and not eating enough potassium from fruits and vegetables. High blood pressure is a leading risk factor for heart attack, stroke, congestive heart failure, dementia, kidney failure, and premature death. Long-term effects of excessive salt intake include stiffening of the arteries and thickening of heart muscle and organ damage. Recommendations include ways to reduce hypertension and the risk of heart disease.  Diseases of Our Time - Focusing on Diabetes Clinical staff conducted group or individual video education with verbal and written material and guidebook.  Patient learns why the best way to stop diseases of our time is prevention, through food and other lifestyle changes. Medicine (such as prescription pills and surgeries) is often only a Band-Aid on the problem, not a long-term solution. Most common diseases of our time include obesity, type 2 diabetes, hypertension, heart disease, and cancer. The Pritikin Program is recommended and has been proven to help reduce, reverse, and/or prevent the damaging effects of metabolic syndrome.  Nutrition   Overview of the Pritikin Eating Plan  Clinical staff conducted group or  individual video education with verbal and written material and guidebook.  Patient learns about the Pritikin Eating Plan for disease risk reduction. The Pritikin Eating Plan emphasizes a wide variety of unrefined, minimally-processed carbohydrates, like fruits, vegetables, whole grains, and legumes. Go, Caution, and Stop food choices are explained. Plant-based and lean animal proteins are emphasized. Rationale provided for low sodium intake for blood pressure control, low added sugars for blood sugar stabilization, and low added fats and oils for coronary artery disease risk reduction and weight management.  Calorie Density  Clinical staff conducted group or individual video education with verbal and written material and guidebook.  Patient learns about calorie density and how it impacts the Pritikin Eating Plan. Knowing the characteristics of the food you choose will help you decide whether those foods will lead to weight gain or weight loss, and whether you want to consume more or less of them. Weight loss is usually a side effect of the Pritikin Eating Plan because of its focus on low calorie-dense foods.  Label Reading  Clinical staff conducted group or individual video education with verbal and written material and guidebook.  Patient learns about the Pritikin recommended label reading guidelines and corresponding recommendations regarding calorie density, added sugars, sodium content, and whole grains.  Dining Out - Part 1  Clinical staff conducted group or individual video education with verbal and written material and guidebook.  Patient learns that restaurant meals can be sabotaging because they can be so high in calories, fat, sodium, and/or sugar. Patient learns recommended strategies on how to positively address this and avoid unhealthy pitfalls.  Facts on Fats  Clinical staff conducted group or individual video education with verbal and written material and guidebook.  Patient learns  that lifestyle modifications can be just as effective, if not more so, as many medications for lowering your risk of heart disease. A Pritikin lifestyle can help to reduce your risk of inflammation and atherosclerosis (cholesterol build-up, or plaque, in the artery walls). Lifestyle interventions such as dietary choices and physical activity address the cause of atherosclerosis. A review of the types of fats and their impact on blood cholesterol levels, along with dietary recommendations to reduce fat intake is also included.  Nutrition Action Plan  Clinical staff conducted group or individual video  education with verbal and written material and guidebook.  Patient learns how to incorporate Pritikin recommendations into their lifestyle. Recommendations include planning and keeping personal health goals in mind as an important part of their success.  Healthy Mind-Set    Healthy Minds, Bodies, Hearts  Clinical staff conducted group or individual video education with verbal and written material and guidebook.  Patient learns how to identify when they are stressed. Video will discuss the impact of that stress, as well as the many benefits of stress management. Patient will also be introduced to stress management techniques. The way we think, act, and feel has an impact on our hearts.  How Our Thoughts Can Heal Our Hearts  Clinical staff conducted group or individual video education with verbal and written material and guidebook.  Patient learns that negative thoughts can cause depression and anxiety. This can result in negative lifestyle behavior and serious health problems. Cognitive behavioral therapy is an effective method to help control our thoughts in order to change and improve our emotional outlook.  Additional Videos:  Exercise    Improving Performance  Clinical staff conducted group or individual video education with verbal and written material and guidebook.  Patient learns to use a  non-linear approach by alternating intensity levels and lengths of time spent exercising to help burn more calories and lose more body fat. Cardiovascular exercise helps improve heart health, metabolism, hormonal balance, blood sugar control, and recovery from fatigue. Resistance training improves strength, endurance, balance, coordination, reaction time, metabolism, and muscle mass. Flexibility exercise improves circulation, posture, and balance. Seek guidance from your physician and exercise physiologist before implementing an exercise routine and learn your capabilities and proper form for all exercise.  Introduction to Yoga  Clinical staff conducted group or individual video education with verbal and written material and guidebook.  Patient learns about yoga, a discipline of the coming together of mind, breath, and body. The benefits of yoga include improved flexibility, improved range of motion, better posture and core strength, increased lung function, weight loss, and positive self-image. Yoga's heart health benefits include lowered blood pressure, healthier heart rate, decreased cholesterol and triglyceride levels, improved immune function, and reduced stress. Seek guidance from your physician and exercise physiologist before implementing an exercise routine and learn your capabilities and proper form for all exercise.  Medical   Aging: Enhancing Your Quality of Life  Clinical staff conducted group or individual video education with verbal and written material and guidebook.  Patient learns key strategies and recommendations to stay in good physical health and enhance quality of life, such as prevention strategies, having an advocate, securing a Health Care Proxy and Power of Attorney, and keeping a list of medications and system for tracking them. It also discusses how to avoid risk for bone loss.  Biology of Weight Control  Clinical staff conducted group or individual video education with  verbal and written material and guidebook.  Patient learns that weight gain occurs because we consume more calories than we burn (eating more, moving less). Even if your body weight is normal, you may have higher ratios of fat compared to muscle mass. Too much body fat puts you at increased risk for cardiovascular disease, heart attack, stroke, type 2 diabetes, and obesity-related cancers. In addition to exercise, following the Pritikin Eating Plan can help reduce your risk.  Decoding Lab Results  Clinical staff conducted group or individual video education with verbal and written material and guidebook.  Patient learns that lab test reflects one  measurement whose values change over time and are influenced by many factors, including medication, stress, sleep, exercise, food, hydration, pre-existing medical conditions, and more. It is recommended to use the knowledge from this video to become more involved with your lab results and evaluate your numbers to speak with your doctor.   Diseases of Our Time - Overview  Clinical staff conducted group or individual video education with verbal and written material and guidebook.  Patient learns that according to the CDC, 50% to 70% of chronic diseases (such as obesity, type 2 diabetes, elevated lipids, hypertension, and heart disease) are avoidable through lifestyle improvements including healthier food choices, listening to satiety cues, and increased physical activity.  Sleep Disorders Clinical staff conducted group or individual video education with verbal and written material and guidebook.  Patient learns how good quality and duration of sleep are important to overall health and well-being. Patient also learns about sleep disorders and how they impact health along with recommendations to address them, including discussing with a physician.  Nutrition  Dining Out - Part 2 Clinical staff conducted group or individual video education with verbal and  written material and guidebook.  Patient learns how to plan ahead and communicate in order to maximize their dining experience in a healthy and nutritious manner. Included are recommended food choices based on the type of restaurant the patient is visiting.   Fueling a Banker conducted group or individual video education with verbal and written material and guidebook.  There is a strong connection between our food choices and our health. Diseases like obesity and type 2 diabetes are very prevalent and are in large-part due to lifestyle choices. The Pritikin Eating Plan provides plenty of food and hunger-curbing satisfaction. It is easy to follow, affordable, and helps reduce health risks.  Menu Workshop  Clinical staff conducted group or individual video education with verbal and written material and guidebook.  Patient learns that restaurant meals can sabotage health goals because they are often packed with calories, fat, sodium, and sugar. Recommendations include strategies to plan ahead and to communicate with the manager, chef, or server to help order a healthier meal.  Planning Your Eating Strategy  Clinical staff conducted group or individual video education with verbal and written material and guidebook.  Patient learns about the Pritikin Eating Plan and its benefit of reducing the risk of disease. The Pritikin Eating Plan does not focus on calories. Instead, it emphasizes high-quality, nutrient-rich foods. By knowing the characteristics of the foods, we choose, we can determine their calorie density and make informed decisions.  Targeting Your Nutrition Priorities  Clinical staff conducted group or individual video education with verbal and written material and guidebook.  Patient learns that lifestyle habits have a tremendous impact on disease risk and progression. This video provides eating and physical activity recommendations based on your personal health goals,  such as reducing LDL cholesterol, losing weight, preventing or controlling type 2 diabetes, and reducing high blood pressure.  Vitamins and Minerals  Clinical staff conducted group or individual video education with verbal and written material and guidebook.  Patient learns different ways to obtain key vitamins and minerals, including through a recommended healthy diet. It is important to discuss all supplements you take with your doctor.   Healthy Mind-Set    Smoking Cessation  Clinical staff conducted group or individual video education with verbal and written material and guidebook.  Patient learns that cigarette smoking and tobacco addiction pose a serious health  risk which affects millions of people. Stopping smoking will significantly reduce the risk of heart disease, lung disease, and many forms of cancer. Recommended strategies for quitting are covered, including working with your doctor to develop a successful plan.  Culinary   Becoming a Set designer conducted group or individual video education with verbal and written material and guidebook.  Patient learns that cooking at home can be healthy, cost-effective, quick, and puts them in control. Keys to cooking healthy recipes will include looking at your recipe, assessing your equipment needs, planning ahead, making it simple, choosing cost-effective seasonal ingredients, and limiting the use of added fats, salts, and sugars.  Cooking - Breakfast and Snacks  Clinical staff conducted group or individual video education with verbal and written material and guidebook.  Patient learns how important breakfast is to satiety and nutrition through the entire day. Recommendations include key foods to eat during breakfast to help stabilize blood sugar levels and to prevent overeating at meals later in the day. Planning ahead is also a key component.  Cooking - Educational psychologist conducted group or individual video  education with verbal and written material and guidebook.  Patient learns eating strategies to improve overall health, including an approach to cook more at home. Recommendations include thinking of animal protein as a side on your plate rather than center stage and focusing instead on lower calorie dense options like vegetables, fruits, whole grains, and plant-based proteins, such as beans. Making sauces in large quantities to freeze for later and leaving the skin on your vegetables are also recommended to maximize your experience.  Cooking - Healthy Salads and Dressing Clinical staff conducted group or individual video education with verbal and written material and guidebook.  Patient learns that vegetables, fruits, whole grains, and legumes are the foundations of the Pritikin Eating Plan. Recommendations include how to incorporate each of these in flavorful and healthy salads, and how to create homemade salad dressings. Proper handling of ingredients is also covered. Cooking - Soups and [employer] - Soups and Desserts Clinical staff conducted group or individual video education with verbal and written material and guidebook.  Patient learns that Pritikin soups and desserts make for easy, nutritious, and delicious snacks and meal components that are low in sodium, fat, sugar, and calorie density, while high in vitamins, minerals, and filling fiber. Recommendations include simple and healthy ideas for soups and desserts.   Overview     The Pritikin Solution Program Overview Clinical staff conducted group or individual video education with verbal and written material and guidebook.  Patient learns that the results of the Pritikin Program have been documented in more than 100 articles published in peer-reviewed journals, and the benefits include reducing risk factors for (and, in some cases, even reversing) high cholesterol, high blood pressure, type 2 diabetes, obesity, and more! An overview of  the three key pillars of the Pritikin Program will be covered: eating well, doing regular exercise, and having a healthy mind-set.  WORKSHOPS  Exercise: Exercise Basics: Building Your Action Plan Clinical staff led group instruction and group discussion with PowerPoint presentation and patient guidebook. To enhance the learning environment the use of posters, models and videos may be added. At the conclusion of this workshop, patients will comprehend the difference between physical activity and exercise, as well as the benefits of incorporating both, into their routine. Patients will understand the FITT (Frequency, Intensity, Time, and Type) principle and how to use it  to build an exercise action plan. In addition, safety concerns and other considerations for exercise and cardiac rehab will be addressed by the presenter. The purpose of this lesson is to promote a comprehensive and effective weekly exercise routine in order to improve patients' overall level of fitness.   Managing Heart Disease: Your Path to a Healthier Heart Clinical staff led group instruction and group discussion with PowerPoint presentation and patient guidebook. To enhance the learning environment the use of posters, models and videos may be added.At the conclusion of this workshop, patients will understand the anatomy and physiology of the heart. Additionally, they will understand how Pritikin's three pillars impact the risk factors, the progression, and the management of heart disease.  The purpose of this lesson is to provide a high-level overview of the heart, heart disease, and how the Pritikin lifestyle positively impacts risk factors.  Exercise Biomechanics Clinical staff led group instruction and group discussion with PowerPoint presentation and patient guidebook. To enhance the learning environment the use of posters, models and videos may be added. Patients will learn how the structural parts of their bodies  function and how these functions impact their daily activities, movement, and exercise. Patients will learn how to promote a neutral spine, learn how to manage pain, and identify ways to improve their physical movement in order to promote healthy living. The purpose of this lesson is to expose patients to common physical limitations that impact physical activity. Participants will learn practical ways to adapt and manage aches and pains, and to minimize their effect on regular exercise. Patients will learn how to maintain good posture while sitting, walking, and lifting.  Balance Training and Fall Prevention  Clinical staff led group instruction and group discussion with PowerPoint presentation and patient guidebook. To enhance the learning environment the use of posters, models and videos may be added. At the conclusion of this workshop, patients will understand the importance of their sensorimotor skills (vision, proprioception, and the vestibular system) in maintaining their ability to balance as they age. Patients will apply a variety of balancing exercises that are appropriate for their current level of function. Patients will understand the common causes for poor balance, possible solutions to these problems, and ways to modify their physical environment in order to minimize their fall risk. The purpose of this lesson is to teach patients about the importance of maintaining balance as they age and ways to minimize their risk of falling.  WORKSHOPS   Nutrition:  Fueling a Ship broker led group instruction and group discussion with PowerPoint presentation and patient guidebook. To enhance the learning environment the use of posters, models and videos may be added. Patients will review the foundational principles of the Pritikin Eating Plan and understand what constitutes a serving size in each of the food groups. Patients will also learn Pritikin-friendly foods that are better  choices when away from home and review make-ahead meal and snack options. Calorie density will be reviewed and applied to three nutrition priorities: weight maintenance, weight loss, and weight gain. The purpose of this lesson is to reinforce (in a group setting) the key concepts around what patients are recommended to eat and how to apply these guidelines when away from home by planning and selecting Pritikin-friendly options. Patients will understand how calorie density may be adjusted for different weight management goals.  Mindful Eating  Clinical staff led group instruction and group discussion with PowerPoint presentation and patient guidebook. To enhance the learning environment the use of  posters, models and videos may be added. Patients will briefly review the concepts of the Pritikin Eating Plan and the importance of low-calorie dense foods. The concept of mindful eating will be introduced as well as the importance of paying attention to internal hunger signals. Triggers for non-hunger eating and techniques for dealing with triggers will be explored. The purpose of this lesson is to provide patients with the opportunity to review the basic principles of the Pritikin Eating Plan, discuss the value of eating mindfully and how to measure internal cues of hunger and fullness using the Hunger Scale. Patients will also discuss reasons for non-hunger eating and learn strategies to use for controlling emotional eating.  Targeting Your Nutrition Priorities Clinical staff led group instruction and group discussion with PowerPoint presentation and patient guidebook. To enhance the learning environment the use of posters, models and videos may be added. Patients will learn how to determine their genetic susceptibility to disease by reviewing their family history. Patients will gain insight into the importance of diet as part of an overall healthy lifestyle in mitigating the impact of genetics and other  environmental insults. The purpose of this lesson is to provide patients with the opportunity to assess their personal nutrition priorities by looking at their family history, their own health history and current risk factors. Patients will also be able to discuss ways of prioritizing and modifying the Pritikin Eating Plan for their highest risk areas  Menu  Clinical staff led group instruction and group discussion with PowerPoint presentation and patient guidebook. To enhance the learning environment the use of posters, models and videos may be added. Using menus brought in from [employer] restaurants, or printed from [employer] menus, patients will apply the Pritikin dining out guidelines that were presented in the [employer] Group[employer] Dining Out educational video. Patients will also be able to practice these guidelines in a variety of provided scenarios. The purpose of this lesson is to provide patients with the opportunity to practice hands-on learning of the Pritikin Dining Out guidelines with actual menus and practice scenarios.  Label Reading Clinical staff led group instruction and group discussion with PowerPoint presentation and patient guidebook. To enhance the learning environment the use of posters, models and videos may be added. Patients will review and discuss the Pritikin label reading guidelines presented in Pritikin's Label Reading Educational series video. Using fool labels brought in from local grocery stores and markets, patients will apply the label reading guidelines and determine if the packaged food meet the Pritikin guidelines. The purpose of this lesson is to provide patients with the opportunity to review, discuss, and practice hands-on learning of the Pritikin Label Reading guidelines with actual packaged food labels. Cooking School  Pritikin's [employer]ing School Workshops are designed to teach patients ways to prepare quick, simple, and affordable recipes at home. The importance of nutrition's role in  chronic disease risk reduction is reflected in its emphasis in the overall Pritikin program. By learning how to prepare essential core Pritikin Eating Plan recipes, patients will increase control over what they eat; be able to customize the flavor of foods without the use of added salt, sugar, or fat; and improve the quality of the food they consume. By learning a set of core recipes which are easily assembled, quickly prepared, and affordable, patients are more likely to prepare more healthy foods at home. These workshops focus on convenient breakfasts, simple entres, side dishes, and desserts which can be prepared with minimal effort and are consistent with nutrition recommendations for  cardiovascular risk reduction. Cooking [employer] are taught by a Armed forces logistics/support/administrative officer (RD) who has been trained by the [employer]. The chef or RD has a clear understanding of the importance of minimizing - if not completely eliminating - added fat, sugar, and sodium in recipes. Throughout the series of Cooking School Workshop sessions, patients will learn about healthy ingredients and efficient methods of cooking to build confidence in their capability to prepare    Cooking School weekly topics:  Adding Flavor- Sodium-Free  Fast and Healthy Breakfasts  Powerhouse Plant-Based Proteins  Satisfying Salads and Dressings  Simple Sides and Sauces  International Cuisine-Spotlight on the [employer] Zones  Delicious Desserts  Savory Soups  Efficiency Cooking - Meals in a Snap  Tasty Appetizers and Snacks  Comforting Weekend Breakfasts  One-Pot Wonders   Fast Evening Meals  Easy Entertaining  Personalizing Your Pritikin Plate  WORKSHOPS   Healthy Mindset (Psychosocial): New Thoughts, New Behaviors Clinical staff led group instruction and group discussion with PowerPoint presentation and patient guidebook. To enhance the learning environment the use of posters, models and videos may be added.  Patients will learn and practice techniques for developing effective health and lifestyle goals. Patients will be able to effectively apply the goal setting process learned to develop at least one new personal goal.  The purpose of this lesson is to expose patients to a new skill set of behavior modification techniques such as techniques setting SMART goals, overcoming barriers, and achieving new thoughts and new behaviors.  Managing Moods and Relationships Clinical staff led group instruction and group discussion with PowerPoint presentation and patient guidebook. To enhance the learning environment the use of posters, models and videos may be added. Patients will learn how emotional and chronic stress factors can impact their health and relationships. They will learn healthy ways to manage their moods and utilize positive coping mechanisms. In addition, ICR patients will learn ways to improve communication skills. The purpose of this lesson is to expose patients to ways of understanding how one's mood and health are intimately connected. Developing a healthy outlook can help build positive relationships and connections with others. Patients will understand the importance of utilizing effective communication skills that include actively listening and being heard. They will learn and understand the importance of the "4 Cs" and especially Connections in fostering of a Healthy Mind-Set.  Healthy Sleep for a Healthy Heart Clinical staff led group instruction and group discussion with PowerPoint presentation and patient guidebook. To enhance the learning environment the use of posters, models and videos may be added. At the conclusion of this workshop, patients will be able to demonstrate knowledge of the importance of sleep to overall health, well-being, and quality of life. They will understand the symptoms of, and treatments for, common sleep disorders. Patients will also be able to identify daytime and  nighttime behaviors which impact sleep, and they will be able to apply these tools to help manage sleep-related challenges. The purpose of this lesson is to provide patients with a general overview of sleep and outline the importance of quality sleep. Patients will learn about a few of the most common sleep disorders. Patients will also be introduced to the concept of "sleep hygiene," and discover ways to self-manage certain sleeping problems through simple daily behavior changes. Finally, the workshop will motivate patients by clarifying the links between quality sleep and their goals of heart-healthy living.   Recognizing and Reducing Stress Clinical staff led group instruction and group  discussion with PowerPoint presentation and patient guidebook. To enhance the learning environment the use of posters, models and videos may be added. At the conclusion of this workshop, patients will be able to understand the types of stress reactions, differentiate between acute and chronic stress, and recognize the impact that chronic stress has on their health. They will also be able to apply different coping mechanisms, such as reframing negative self-talk. Patients will have the opportunity to practice a variety of stress management techniques, such as deep abdominal breathing, progressive muscle relaxation, and/or guided imagery.  The purpose of this lesson is to educate patients on the role of stress in their lives and to provide healthy techniques for coping with it.  Learning Barriers/Preferences:  Learning Barriers/Preferences - 06/12/22 1334       Learning Barriers/Preferences   Learning Barriers None    Learning Preferences Verbal Instruction;Written Material             Education Topics:  Knowledge Questionnaire Score:  Knowledge Questionnaire Score - 06/12/22 1000       Knowledge Questionnaire Score   Pre Score 20/24             Core Components/Risk Factors/Patient Goals at  Admission:  Personal Goals and Risk Factors at Admission - 06/12/22 0832       Core Components/Risk Factors/Patient Goals on Admission    Weight Management Yes;Obesity    Intervention Weight Management/Obesity: Establish reasonable short term and long term weight goals.;Obesity: Provide education and appropriate resources to help participant work on and attain dietary goals.    Expected Outcomes Short Term: Continue to assess and modify interventions until short term weight is achieved;Long Term: Adherence to nutrition and physical activity/exercise program aimed toward attainment of established weight goal;Weight Loss: Understanding of general recommendations for a balanced deficit meal plan, which promotes 1-2 lb weight loss per week and includes a negative energy balance of [phone] kcal/d    Lipids Yes    Intervention Provide education and support for participant on nutrition & aerobic/resistive exercise along with prescribed medications to achieve LDL 70mg , HDL >40mg .    Expected Outcomes Short Term: Participant states understanding of desired cholesterol values and is compliant with medications prescribed. Participant is following exercise prescription and nutrition guidelines.;Long Term: Cholesterol controlled with medications as prescribed, with individualized exercise RX and with personalized nutrition plan. Value goals: LDL < , HDL > 40 mg.             Core Components/Risk Factors/Patient Goals Review:   Goals and Risk Factor Review     Row Name 07/04/22 1114             Core Components/Risk Factors/Patient Goals Review   Personal Goals Review Weight Management/Obesity;Lipids       Review Gema started cardiac rehab on 07/04/22 and did well with exercise       Expected Outcomes Tymeka will continue to participate in intensive cardiac rehab for exercise, nutrition and lifestyle modifications                Core Components/Risk Factors/Patient Goals at  Discharge (Final Review):   Goals and Risk Factor Review - 07/04/22 1114       Core Components/Risk Factors/Patient Goals Review   Personal Goals Review Weight Management/Obesity;Lipids    Review Euna started cardiac rehab on 07/04/22 and did well with exercise    Expected Outcomes Rozanna will continue to participate in intensive cardiac rehab for exercise, nutrition and lifestyle modifications  ITP Comments:  ITP Comments     Row Name 06/12/22 0830 06/12/22 0857 07/04/22 1111       ITP Comments Medical Director- Dr. Armanda Magic, MD Dr. Armanda Magic, Medical Director, Introduction to [facility] education intensive cardiac rehab program. Initial [facility] orientation packet reviewed with patient. 30 Day ITP Review. Annsley started cardiac rehab on 10/12/ 23 and did well with exercise              Comments: Pt started cardiac rehab today.  Pt tolerated light exercise without difficulty. VSS, telemetry-Sinus Rhythm, asymptomatic.  Medication list reconciled. Pt denies barriers to medicaiton compliance.  PSYCHOSOCIAL ASSESSMENT:  PHQ-0. Pt exhibits positive coping skills, hopeful outlook with supportive family. No psychosocial needs identified at this time, no psychosocial interventions necessary.  Pt oriented to exercise equipment and routine.    Understanding verbalized. Thayer Headings RN BSN

## 2022-07-09 ENCOUNTER — Encounter (HOSPITAL_COMMUNITY): Payer: PPO

## 2022-07-09 ENCOUNTER — Telehealth (HOSPITAL_COMMUNITY): Payer: Self-pay | Admitting: *Deleted

## 2022-07-09 NOTE — Telephone Encounter (Signed)
Left message to call cardiac rehab.Barnet Pall, RN,BSN 07/09/2022 9:09 AM

## 2022-07-09 NOTE — Telephone Encounter (Signed)
Patient left message on department voicemail today. She has a work conflict and is unable to make cardiac rehab appointment today. May not be able to make next appointment.

## 2022-07-10 ENCOUNTER — Telehealth (HOSPITAL_COMMUNITY): Payer: Self-pay

## 2022-07-10 NOTE — Telephone Encounter (Signed)
Pt called and stated that she is unable to continue with cardiac rehab due to work. Canceled pt cardiac rehab appointments.

## 2022-07-11 ENCOUNTER — Encounter (HOSPITAL_COMMUNITY): Payer: PPO

## 2022-07-13 ENCOUNTER — Encounter (HOSPITAL_COMMUNITY): Payer: Self-pay | Admitting: *Deleted

## 2022-07-13 DIAGNOSIS — Z951 Presence of aortocoronary bypass graft: Secondary | ICD-10-CM

## 2022-07-13 DIAGNOSIS — I214 Non-ST elevation (NSTEMI) myocardial infarction: Secondary | ICD-10-CM

## 2022-07-13 NOTE — Progress Notes (Signed)
Rasha attended 1 exercise session and orientation. Daryle asked to be discharged due to her conflicting work schedule.Harrell Gave RN BSN

## 2022-07-16 ENCOUNTER — Encounter (HOSPITAL_COMMUNITY): Payer: PPO

## 2022-07-18 ENCOUNTER — Encounter (HOSPITAL_COMMUNITY): Payer: PPO

## 2022-07-23 ENCOUNTER — Encounter (HOSPITAL_COMMUNITY): Payer: PPO

## 2022-07-25 ENCOUNTER — Encounter (HOSPITAL_COMMUNITY): Payer: PPO

## 2022-07-30 ENCOUNTER — Encounter (HOSPITAL_COMMUNITY): Payer: PPO

## 2022-08-01 ENCOUNTER — Encounter (HOSPITAL_COMMUNITY): Payer: PPO

## 2022-08-06 ENCOUNTER — Encounter (HOSPITAL_COMMUNITY): Payer: PPO

## 2022-08-08 ENCOUNTER — Encounter (HOSPITAL_COMMUNITY): Payer: PPO

## 2022-10-06 DIAGNOSIS — R079 Chest pain, unspecified: Secondary | ICD-10-CM | POA: Diagnosis not present

## 2022-10-06 DIAGNOSIS — N390 Urinary tract infection, site not specified: Secondary | ICD-10-CM | POA: Diagnosis not present

## 2022-10-06 DIAGNOSIS — R0600 Dyspnea, unspecified: Secondary | ICD-10-CM | POA: Diagnosis not present

## 2022-10-06 DIAGNOSIS — R1013 Epigastric pain: Secondary | ICD-10-CM | POA: Diagnosis not present

## 2023-04-11 DIAGNOSIS — M818 Other osteoporosis without current pathological fracture: Secondary | ICD-10-CM | POA: Diagnosis not present

## 2023-04-11 DIAGNOSIS — Z6841 Body Mass Index (BMI) 40.0 and over, adult: Secondary | ICD-10-CM | POA: Diagnosis not present

## 2023-04-11 DIAGNOSIS — E785 Hyperlipidemia, unspecified: Secondary | ICD-10-CM | POA: Diagnosis not present

## 2023-04-11 DIAGNOSIS — R0609 Other forms of dyspnea: Secondary | ICD-10-CM | POA: Diagnosis not present

## 2023-04-11 DIAGNOSIS — R7303 Prediabetes: Secondary | ICD-10-CM | POA: Diagnosis not present

## 2023-04-11 DIAGNOSIS — Z79899 Other long term (current) drug therapy: Secondary | ICD-10-CM | POA: Diagnosis not present

## 2023-04-11 DIAGNOSIS — Z0000 Encounter for general adult medical examination without abnormal findings: Secondary | ICD-10-CM | POA: Diagnosis not present

## 2023-04-11 DIAGNOSIS — E611 Iron deficiency: Secondary | ICD-10-CM | POA: Diagnosis not present

## 2023-04-11 DIAGNOSIS — E559 Vitamin D deficiency, unspecified: Secondary | ICD-10-CM | POA: Diagnosis not present

## 2023-04-11 DIAGNOSIS — I2510 Atherosclerotic heart disease of native coronary artery without angina pectoris: Secondary | ICD-10-CM | POA: Diagnosis not present

## 2023-04-11 DIAGNOSIS — Z951 Presence of aortocoronary bypass graft: Secondary | ICD-10-CM | POA: Diagnosis not present

## 2023-04-11 LAB — LAB REPORT - SCANNED
A1c: 5.7
eGFR: 77

## 2023-04-16 ENCOUNTER — Other Ambulatory Visit: Payer: Self-pay | Admitting: Cardiovascular Disease

## 2023-05-21 ENCOUNTER — Telehealth: Payer: Self-pay | Admitting: Cardiovascular Disease

## 2023-05-21 NOTE — Telephone Encounter (Signed)
Patient is requesting a call back from the nurse. 

## 2023-05-22 NOTE — Telephone Encounter (Signed)
Called  patient back about message. Patient stated she had been very fatigue and low on energy. Patient stated she thinks it is her medication. Patient stated her SBP runs between 103 and 98 HR 50's. Patient takes lopressor 12.5 mg BID. Made patient an appointment to see Tereso Newcomer the beginning of the week to get evaluated, but will send message to Dr. Eden Emms to see if she needs to change her lopressor. Patient stated her BP has always been low. Encouraged patient to drink more fluids.

## 2023-05-28 ENCOUNTER — Encounter: Payer: Self-pay | Admitting: Physician Assistant

## 2023-05-28 ENCOUNTER — Ambulatory Visit: Payer: PPO | Attending: Physician Assistant | Admitting: Physician Assistant

## 2023-05-28 VITALS — BP 118/66 | HR 65 | Ht <= 58 in | Wt 189.8 lb

## 2023-05-28 DIAGNOSIS — I2510 Atherosclerotic heart disease of native coronary artery without angina pectoris: Secondary | ICD-10-CM

## 2023-05-28 DIAGNOSIS — R5383 Other fatigue: Secondary | ICD-10-CM | POA: Diagnosis not present

## 2023-05-28 DIAGNOSIS — E7800 Pure hypercholesterolemia, unspecified: Secondary | ICD-10-CM | POA: Diagnosis not present

## 2023-05-28 NOTE — Patient Instructions (Addendum)
Medication Instructions:  Your physician has recommended you make the following change in your medication:   TAKE 1/2 tablet Metoprolol for 3 days then stop it  *If you need a refill on your cardiac medications before your next appointment, please call your pharmacy*   Lab Work: None ordered today, but come fasting to see Dr. Eden Emms and will get lipis  If you have labs (blood work) drawn today and your tests are completely normal, you will receive your results only by: MyChart Message (if you have MyChart) OR A paper copy in the mail If you have any lab test that is abnormal or we need to change your treatment, we will call you to review the results.   Testing/Procedures: None ordered   Follow-Up: At [facility], you and your health needs are our priority.  As part of our continuing mission to provide you with exceptional heart care, we have created designated Provider Care Teams.  These Care Teams include your primary Cardiologist (physician) and Advanced Practice Providers (APPs -  Physician Assistants and Nurse Practitioners) who all work together to provide you with the care you need, when you need it.  We recommend signing up for the patient portal called "MyChart".  Sign up information is provided on this After Visit Summary.  MyChart is used to connect with patients for Virtual Visits (Telemedicine).  Patients are able to view lab/test results, encounter notes, upcoming appointments, etc.  Non-urgent messages can be sent to your provider as well.   To learn more about what you can do with MyChart, go to [email or web address].    Your next appointment:   AS SCHEDULED  Provider:   Charlton Haws, MD     Other Instructions

## 2023-05-28 NOTE — Progress Notes (Signed)
Cardiology Office Note:    Date:  05/28/2023  ID:  Cathy Love, DOB 05/23/1946, MRN [id number] PCP: Mila Palmer, MD  [facility] Providers Cardiologist:  Charlton Haws, MD       Patient Profile:      Coronary artery disease  NSTEMI in 01/2022 s/p CABG TTE 02/18/2022: EF 60-65, no RWMA, normal RVSF, AV sclerosis, RAP 3 Carotid artery disease Hyperlipidemia         History of Present Illness:  Discussed the use of AI scribe software for clinical note transcription with the patient, who gave verbal consent to proceed.    The patient is a 77 year old female with a history of coronary artery disease, status post non-ST elevation myocardial infarction in May 2023, and status post coronary artery bypass graft (CABG). She also has hyperlipidemia. She presents today with a complaint of episodes of extreme exhaustion and weakness that occurred over a period of three weeks, which have since resolved (since this past weekend). The episodes were characterized by extreme exhaustion and weakness, to the point where she had difficulty walking short distances. These episodes occurred twice a day and were not constant. Towards the end of the three-week period, the exhaustion was almost constant. After two days of constant exhaustion, she developed shaking chills and tiredness, which lasted for about 24 hours. After this episode, she started to feel better and has been feeling back to her baseline energy levels since Saturday. She denies any fever, cough, sore throat, vomiting, diarrhea, chest pain, or shortness of breath. She has not changed her medications and has not tested for COVID-19. She denies any urinary symptoms, rashes, joint aches, or swelling.      ROS:  See HPI    Studies Reviewed:   EKG Interpretation Date/Time:  Tuesday May 28 2023 14:45:35 EDT Ventricular Rate:  65 PR Interval:  140 QRS Duration:  86 QT Interval:  408 QTC Calculation: 424 R Axis:   6  Text  Interpretation: Normal sinus rhythm Normal axis No ST-TW changes Normal ECG Confirmed by Tereso Newcomer [phone]) on 05/28/2023 3:06:14 PM    Risk Assessment/Calculations:             Physical Exam:   VS:  BP 118/66   Pulse 65   Ht 4\' 9"  (1.448 m)   Wt 189 lb 12.8 oz (86.1 kg)   SpO2 95%   BMI 41.07 kg/m    Wt Readings from Last 3 Encounters:  05/28/23 189 lb 12.8 oz (86.1 kg)  07/04/22 178 lb 9.2 oz (81 kg)  06/25/22 178 lb 6.4 oz (80.9 kg)    Constitutional:      Appearance: Healthy appearance. Not in distress.  Neck:     Vascular: JVD normal.     Lymphadenopathy: No cervical adenopathy.  Pulmonary:     Breath sounds: Normal breath sounds. No wheezing. No rales.  Cardiovascular:     Normal rate. Regular rhythm.     Murmurs: There is no murmur.  Edema:    Peripheral edema absent.  Abdominal:     Palpations: Abdomen is soft.  Skin:    General: Skin is dry.  Neurological:     Mental Status: Alert and oriented to person, place and time.      Assessment and Plan:  No problem-specific Assessment & Plan notes found for this encounter. Assessment and Plan    Fatigue Resolved. Likely viral syndrome given the acute onset and resolution of symptoms. No evidence of cardiac ischemia or anemia. -If  recurrent exhaustion, will need to order CBC, BMP, and consider Lexiscan Myoview to rule out ischemia.  Hyperlipidemia LDL above goal at 80 in July 2024. She has recently changed her diet. -Continue Lipitor 80 mg every other day. -Come fasting to next visit in October for repeat lipids. -If LDL remains above 55, consider referral to PharmD for PCSK9 inhibitor or siRNA.  Coronary Artery Disease No symptoms of angina. She notes GI upset with aspirin. Echo at the time of her MI showed normal EF. Given results of REDUCE-AMI, there is no clear benefit to remaining on beta blocker therapy. She would like to limit medications as much as possible.  -Continue clopidogrel 75mg  daily and Lipitor  80mg  every other day. -Reduce Metoprolol to 12.5 mg every day x 3 days, then stop.           Dispo:  Return in 1 month (on 06/27/2023) for Scheduled Follow Up w/ Dr. Eden Emms.  Signed, Tereso Newcomer, PA-C

## 2023-05-29 NOTE — Telephone Encounter (Signed)
Patient's issues were addressed at her office visit 05/28/23.

## 2023-06-21 NOTE — Progress Notes (Unsigned)
Cardiology Office Note    Date:  06/21/2023   ID:  Cathy Love, DOB October 30, 1945, MRN [id number]   PCP:  Mila Palmer, MD   [facility]  Cardiologist:  Charlton Haws, MD    History of Present Illness:  Cathy Love is a 77 y.o. 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 She underwent CABG x 3 LIMA-LAD, SVG-Cfx, SVG-PDA 02/21/22. She had normal heart function on echo. LDL 144.   Patient comes in for f/u. Doing well. No significant pain, dyspnea. LDL 80 on labs 05/21/22 much improved on lipitor 80 mg daily   Belongs to a travel club Doing well stopped asa due to stomach upset and taking plavix  ***   Past Medical History:  Diagnosis Date   Carotid artery occlusion    Coronary artery disease    Hyperlipidemia     Past Surgical History:  Procedure Laterality Date   CARDIAC CATHETERIZATION     CORONARY ARTERY BYPASS GRAFT N/A 02/21/2022   Procedure: CORONARY ARTERY BYPASS GRAFTING (CABG) X BYPASSES USING OPEN LEFT INTERNAL MAMMARY ARTERY AND ENDOSCOPIC LEFT GREATER SAPHENOUS VEIN HARVEST.;  Surgeon: Lovett Sox, MD;  Location: [facility] OR;  Service: Open Heart Surgery;  Laterality: N/A;   LEFT HEART CATH AND CORONARY ANGIOGRAPHY N/A 02/20/2022   Procedure: LEFT HEART CATH AND CORONARY ANGIOGRAPHY;  Surgeon: Lyn Records, MD;  Location: [facility] INVASIVE CV LAB;  Service: Cardiovascular;  Laterality: N/A;   TEE WITHOUT CARDIOVERSION N/A 02/21/2022   Procedure: TRANSESOPHAGEAL ECHOCARDIOGRAM (TEE);  Surgeon: Lovett Sox, MD;  Location: [facility];  Service: Open Heart Surgery;  Laterality: N/A;    Current Medications: No outpatient medications have been marked as taking for the 06/27/23 encounter (Appointment) with Wendall Stade, MD.     Allergies:   Patient has no known allergies.   Social History   Socioeconomic History   Marital status: Married    Spouse name: Not on file   Number of children: Not on file    Years of education: 14   Highest education level: Not on file  Occupational History   Not on file  Tobacco Use   Smoking status: Never   Smokeless tobacco: Never  Substance and Sexual Activity   Alcohol use: Not Currently   Drug use: Never   Sexual activity: Yes  Other Topics Concern   Not on file  Social History Narrative   Not on file   Social Determinants of Health   Financial Resource Strain: Not on file  Food Insecurity: Not on file  Transportation Needs: Not on file  Physical Activity: Not on file  Stress: Not on file  Social Connections: Not on file     Family History:  The patient's  family history is not on file.   ROS:   Please see the history of present illness.    ROS All other systems reviewed and are negative.   PHYSICAL EXAM:   VS:  There were no vitals taken for this visit.  Physical Exam  Affect appropriate Healthy:  appears stated age HEENT: normal Neck supple with no adenopathy JVP normal no bruits no thyromegaly Lungs clear with no wheezing and good diaphragmatic motion Heart:  S1/S2 no murmur, no rub, gallop or click PMI normal post sternotomy Abdomen: benighn, BS positve, no tenderness, no AAA no bruit.  No HSM or HJR Distal pulses intact with no bruits No edema Neuro non-focal Left leg greater SVG harvest  Wt Readings from Last 3 Encounters:  05/28/23 189 lb 12.8 oz (86.1 kg)  07/04/22 178 lb 9.2 oz (81 kg)  06/25/22 178 lb 6.4 oz (80.9 kg)      Studies/Labs Reviewed:   EKG:  SR rate 77 nonspecific ST changes 02/22/22      Recent Labs: No results found for requested labs within last 365 days.   Lipid Panel    Component Value Date/Time   CHOL 150 05/21/2022 0835   TRIG 110 05/21/2022 0835   HDL 50 05/21/2022 0835   CHOLHDL 3.0 05/21/2022 0835   CHOLHDL 5.2 02/19/2022 0150   VLDL 34 02/19/2022 0150   LDLCALC 80 05/21/2022 0835    Additional studies/ records that were reviewed today include:  CARDIAC CATHETERIZATION    Result Date: 02/20/2022 CONCLUSIONS: Right dominant anatomy with proximal to mid 90% calcified stenosis in RCA. Short left main Proximal LAD 85 to 90% stenosis followed by post stenotic dilatation/aneurysm.  LAD contains diffuse disease in the midsegment up to 80% after the origin of the second diagonal. Circumflex is large gives origin to 4 obtuse marginal branches.  2 branches are very small.  The first branch contains segmental 95% stenosis ostial to proximal.  The distal circumflex contains 80% stenosis before the origin of the large fourth obtuse marginal. Mid anterior wall focal hypokinesis.  Overall EF 55 to 60%.  EDP is normal. RECOMMENDATIONS: Resume IV heparin Aggressive risk factor modification T CTS consultation for CABG.  Will need to remain in hospital as she presented with non-ST elevation MI awakening with spontaneous jaw and chest discomfort which lasted approximately 2 hours.    ECHOCARDIOGRAM COMPLETE   Result Date: 02/18/2022    ECHOCARDIOGRAM REPORT   Patient Name:   Cathy Love Date of Exam: 02/18/2022 Medical Rec #:  [id number]        Height:       56.5 in Accession #:    [id number]       Weight:       183.6 lb Date of Birth:  10-17-1945       BSA:          1.723 m Patient Age:    75 years         BP:           134/85 mmHg Patient Gender: F                HR:           60 bpm. Exam Location:  Inpatient Procedure: 2D Echo, Cardiac Doppler, Color Doppler and Strain Analysis Indications:    Chest pain                 MI  History:        Patient has no prior history of Echocardiogram examinations.  Sonographer:    Neomia Dear RDCS Referring Phys: [id number] LAURA R INGOLD  Sonographer Comments: Suboptimal parasternal window, suboptimal apical window and suboptimal subcostal window. IMPRESSIONS  1. Left ventricular ejection fraction, by estimation, is 60 to 65%. The left ventricle has normal function. The left ventricle has no regional wall motion abnormalities. Left ventricular diastolic  parameters were normal.  2. Right ventricular systolic function is normal. The right ventricular size is normal.  3. The mitral valve is normal in structure. No evidence of mitral valve regurgitation. No evidence of mitral stenosis.  4. The aortic valve is tricuspid. There is mild calcification of the aortic valve. There is mild thickening of  the aortic valve. Aortic valve regurgitation is not visualized. Aortic valve sclerosis is present, with no evidence of aortic valve stenosis.  5. The inferior vena cava is normal in size with greater than 50% respiratory variability, suggesting right atrial pressure of 3 mmHg. FINDINGS  Left Ventricle: Left ventricular ejection fraction, by estimation, is 60 to 65%. The left ventricle has normal function. The left ventricle has no regional wall motion abnormalities. The left ventricular internal cavity size was normal in size. There is  no left ventricular hypertrophy. Left ventricular diastolic parameters were normal. Right Ventricle: The right ventricular size is normal. No increase in right ventricular wall thickness. Right ventricular systolic function is normal. Left Atrium: Left atrial size was normal in size. Right Atrium: Right atrial size was normal in size. Pericardium: There is no evidence of pericardial effusion. Mitral Valve: The mitral valve is normal in structure. No evidence of mitral valve regurgitation. No evidence of mitral valve stenosis. Tricuspid Valve: The tricuspid valve is normal in structure. Tricuspid valve regurgitation is not demonstrated. No evidence of tricuspid stenosis. Aortic Valve: The aortic valve is tricuspid. There is mild calcification of the aortic valve. There is mild thickening of the aortic valve. Aortic valve regurgitation is not visualized. Aortic valve sclerosis is present, with no evidence of aortic valve stenosis. Aortic valve mean gradient measures 4.0 mmHg. Aortic valve peak gradient measures 7.8 mmHg. Aortic valve area, by VTI  measures 1.73 cm. Pulmonic Valve: The pulmonic valve was normal in structure. Pulmonic valve regurgitation is not visualized. No evidence of pulmonic stenosis. Aorta: The aortic root is normal in size and structure. Venous: The inferior vena cava is normal in size with greater than 50% respiratory variability, suggesting right atrial pressure of 3 mmHg. IAS/Shunts: No atrial level shunt detected by color flow Doppler.  LEFT VENTRICLE PLAX 2D LVIDd:         4.00 cm     Diastology LVIDs:         1.90 cm     LV e' medial:    7.43 cm/s LV PW:         1.30 cm     LV E/e' medial:  5.7 LV IVS:        1.20 cm     LV e' lateral:   6.83 cm/s LVOT diam:     2.10 cm     LV E/e' lateral: 6.3 LV SV:         52 LV SV Index:   30 LVOT Area:     3.46 cm  LV Volumes (MOD) LV vol d, MOD A2C: 41.3 ml LV vol d, MOD A4C: 36.6 ml LV vol s, MOD A2C: 22.7 ml LV vol s, MOD A4C: 19.9 ml LV SV MOD A2C:     18.6 ml LV SV MOD A4C:     36.6 ml LV SV MOD BP:      18.7 ml RIGHT VENTRICLE RV S prime:     9.60 cm/s TAPSE (M-mode): 1.6 cm LEFT ATRIUM             Index LA diam:        3.40 cm 1.97 cm/m LA Vol (A2C):   33.0 ml 19.15 ml/m LA Vol (A4C):   44.9 ml 26.05 ml/m LA Biplane Vol: 39.7 ml 23.04 ml/m  AORTIC VALVE                    PULMONIC VALVE AV Area (Vmax):    1.81  cm     PV Vmax:       0.77 m/s AV Area (Vmean):   1.71 cm     PV Vmean:      55.000 cm/s AV Area (VTI):     1.73 cm     PV VTI:        0.191 m AV Vmax:           140.00 cm/s  PV Peak grad:  2.4 mmHg AV Vmean:          94.300 cm/s  PV Mean grad:  1.0 mmHg AV VTI:            0.301 m AV Peak Grad:      7.8 mmHg AV Mean Grad:      4.0 mmHg LVOT Vmax:         73.20 cm/s LVOT Vmean:        46.600 cm/s LVOT VTI:          0.150 m LVOT/AV VTI ratio: 0.50  AORTA Ao Root diam: 2.40 cm Ao Asc diam:  2.70 cm MITRAL VALVE MV Area (PHT): 2.99 cm    SHUNTS MV Decel Time: 254 msec    Systemic VTI:  0.15 m MV E velocity: 42.70 cm/s  Systemic Diam: 2.10 cm MV A velocity: 70.30 cm/s MV E/A  ratio:  0.61 Charlton Haws MD Electronically signed by Charlton Haws MD Signature Date/Time: 02/18/2022/3:49:03 PM    Final      PLAN:  In order of problems listed above:  CAD S/P CABG  02/03/22 by PVT x 3 LIMA-LAD, SVG-Cfx, SVG-PDA 02/21/22, normal LVEF on echo. Feels well. Continue Lipitor, plavix and metoprolol. Stomach upset with ASA   HLD LDL improved continue high dose lipitor LDL around 80 July 2024 will repeat labs and consider adding Zetia to get to goal  Obesity-exercise and weight loss  Lipid / Liver     F/U in a year   Signed, Charlton Haws, MD  06/21/2023 11:32 AM    [facility] [address] [place], [place], [place]  [address] Phone: [phone]; Fax: [phone]

## 2023-06-24 IMAGING — DX DG LUMBAR SPINE COMPLETE 4+V
5 series · 5 of 5 positions shown · non-contrast
Comparison: None.

CLINICAL DATA: Back pain with history of injury

EXAM:
LUMBAR SPINE - COMPLETE 4+ VIEW

[l-spine ap]
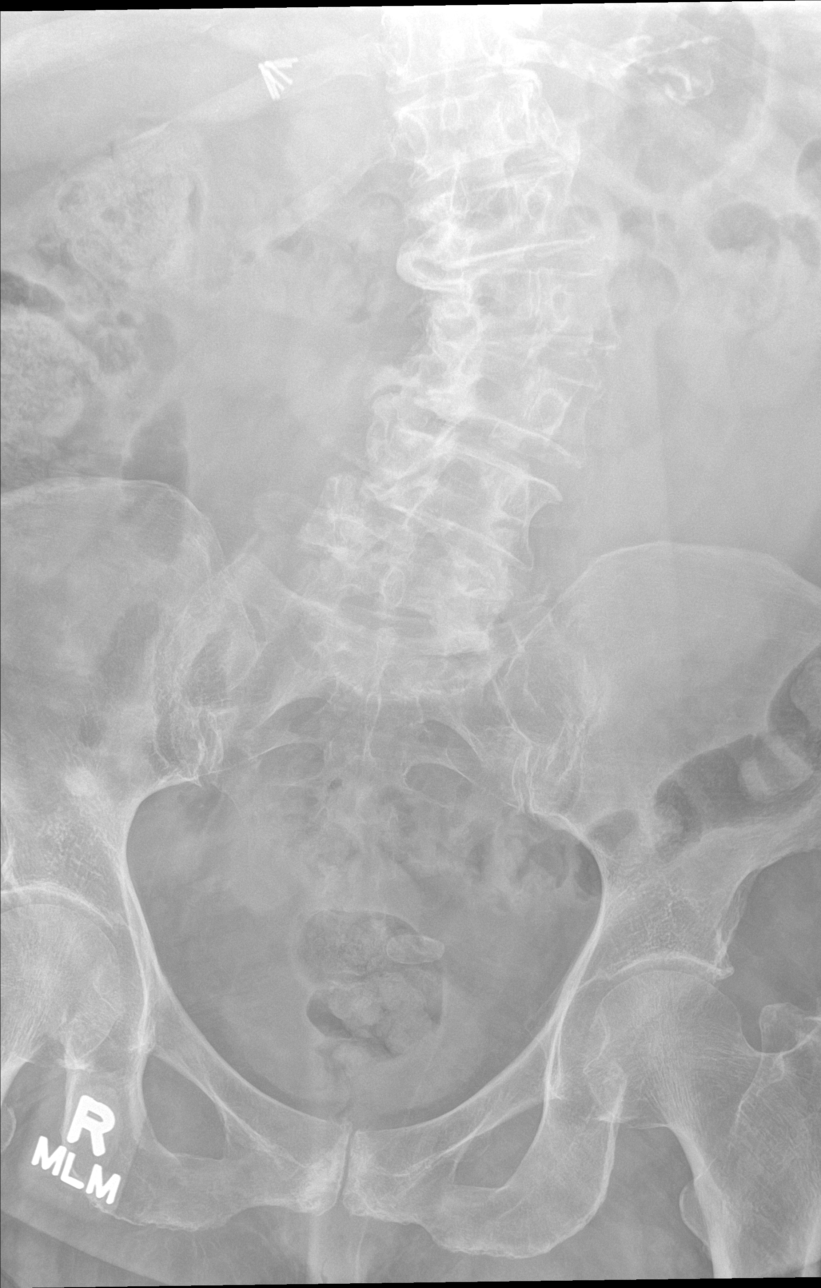

[l-spine obl (1 of 2)]
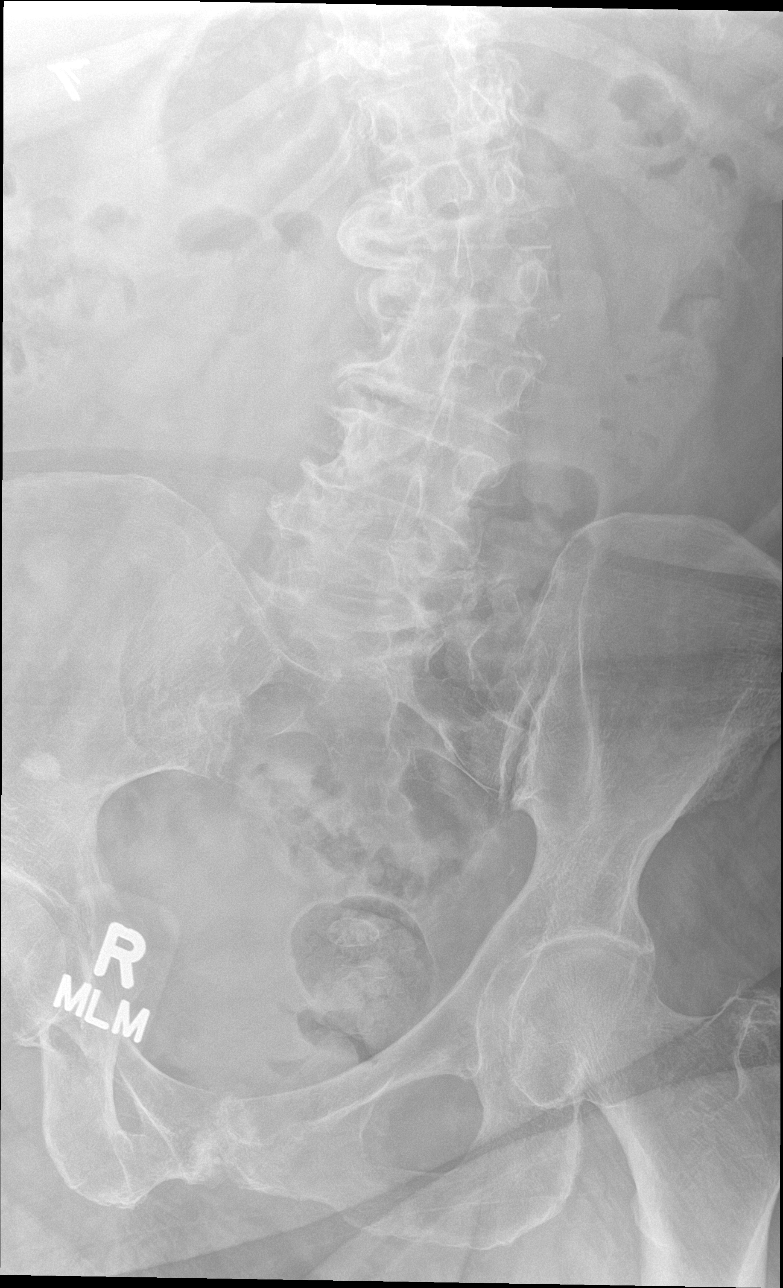

[l-spine obl (2 of 2)]
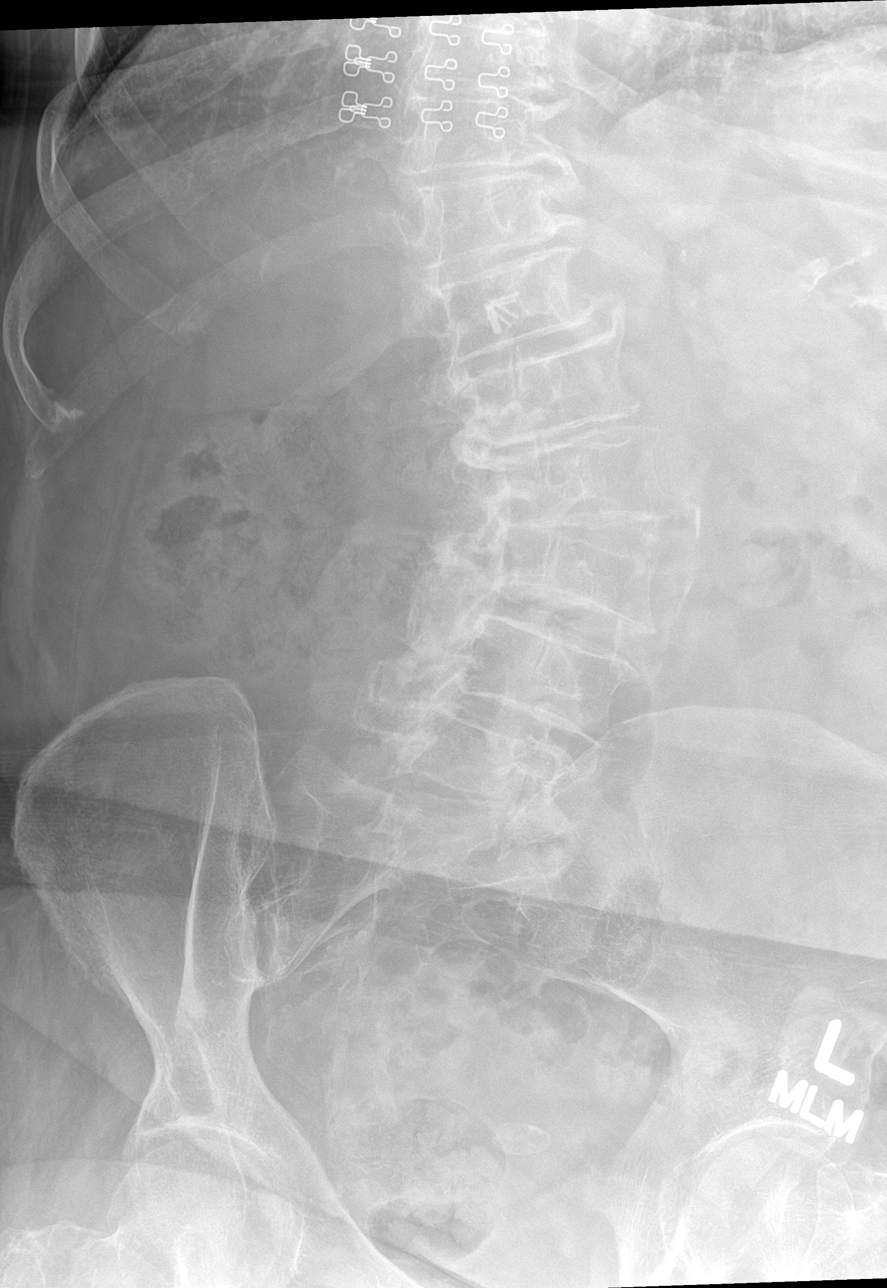

[l-spine lat]
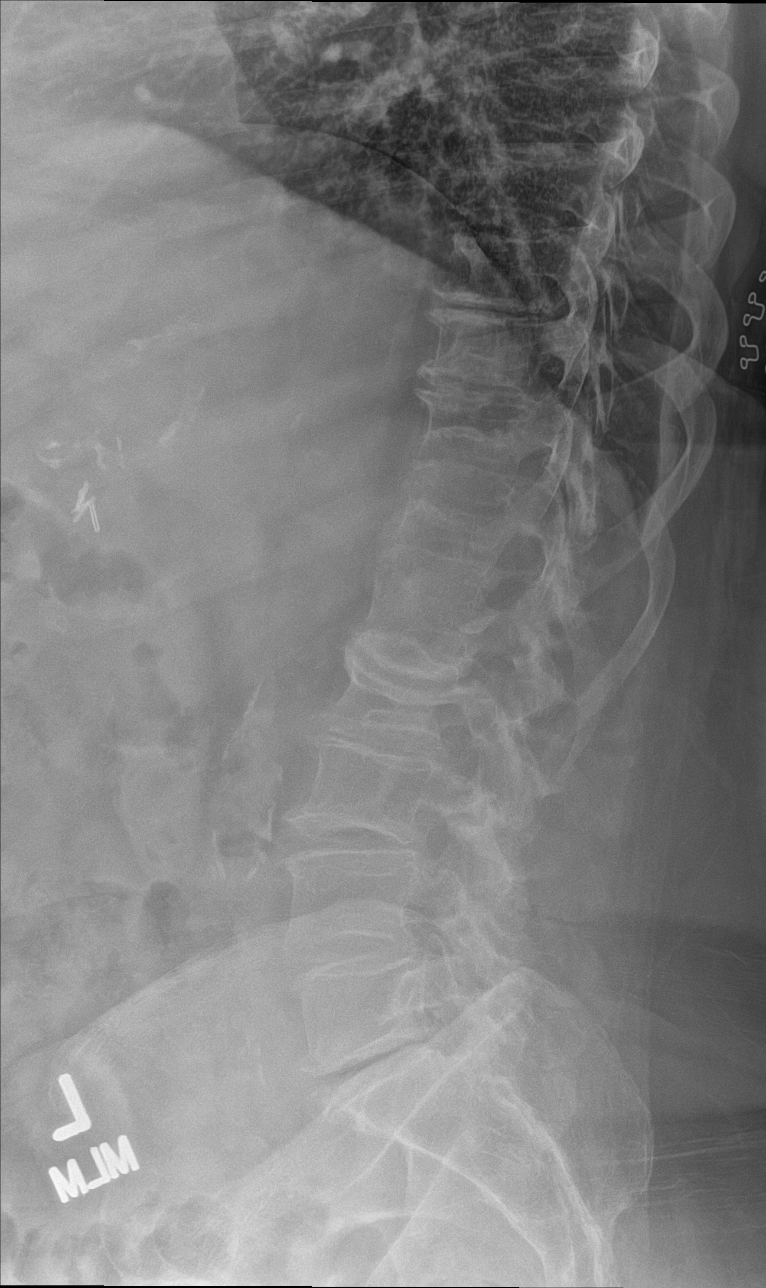

[l-spine spot]
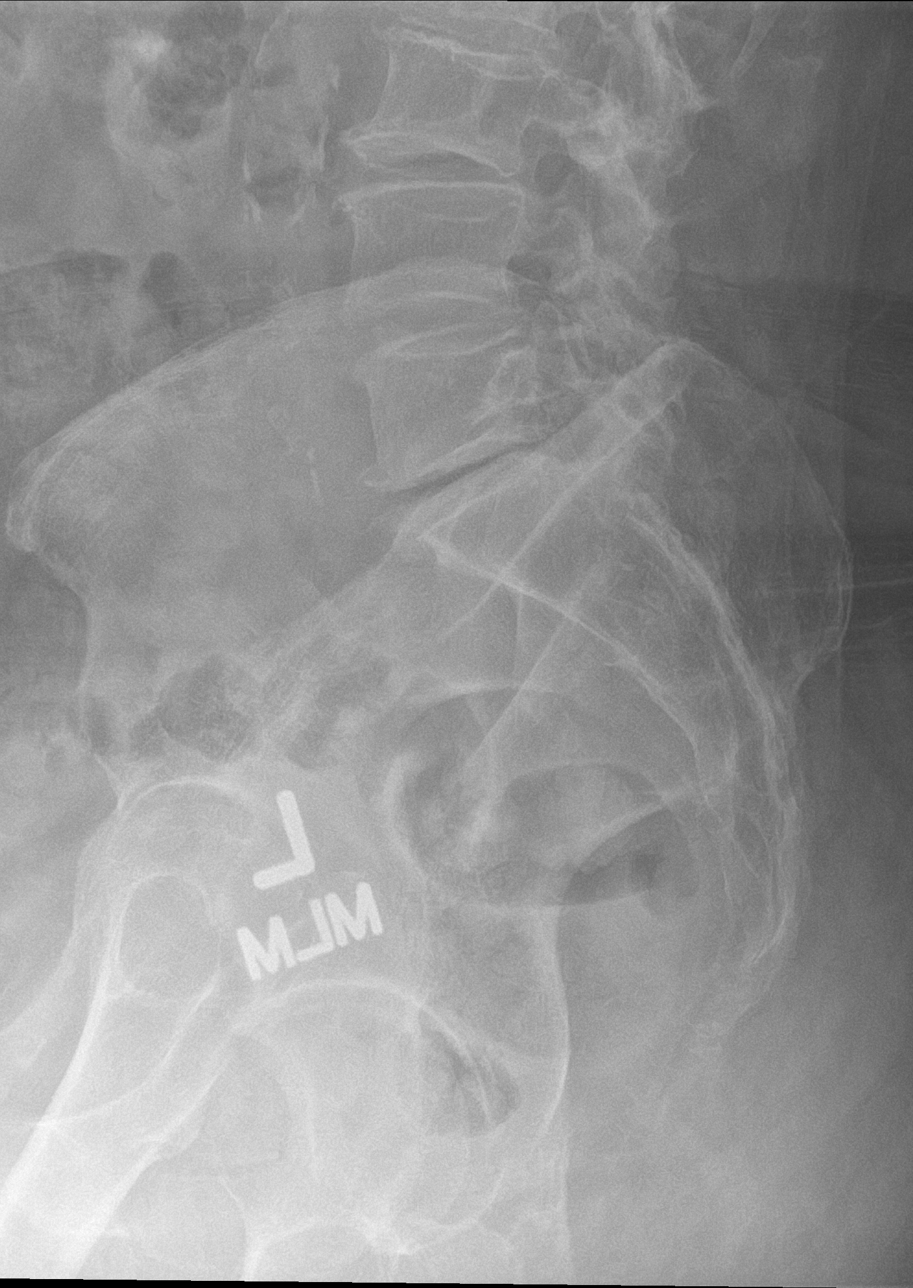

[5 of 5 positions shown; findings below may reference images not displayed]

FINDINGS: No evidence of lumbar spine fracture. Levoscoliosis with rotatory
component. Multilevel moderate degenerative disc disease. Mild facet
arthropathy. Aortic atherosclerotic calcifications. Soft tissues are
unremarkable.
IMPRESSION: No evidence of lumbar spine fracture, although presence of
significant levocurvature limits evaluation of vertebral body
heights on lateral view.

Moderate multilevel degenerative disc disease.

## 2023-06-27 ENCOUNTER — Ambulatory Visit: Payer: PPO | Attending: Cardiovascular Disease | Admitting: Cardiovascular Disease

## 2023-06-27 ENCOUNTER — Encounter: Payer: Self-pay | Admitting: Cardiovascular Disease

## 2023-06-27 VITALS — BP 106/68 | HR 90 | Ht <= 58 in | Wt 188.0 lb

## 2023-06-27 DIAGNOSIS — E785 Hyperlipidemia, unspecified: Secondary | ICD-10-CM

## 2023-06-27 DIAGNOSIS — Z951 Presence of aortocoronary bypass graft: Secondary | ICD-10-CM

## 2023-06-27 MED ORDER — ATORVASTATIN CALCIUM 80 MG PO TABS: 80.0000 mg | ORAL_TABLET | Freq: Every day | ORAL | 3 refills | Status: AC

## 2023-06-27 NOTE — Patient Instructions (Addendum)
Medication Instructions:  Your physician has recommended you make the following change in your medication:  1-INCREASE Lipitor to 80 mg by mouth daily.   *If you need a refill on your cardiac medications before your next appointment, please call your pharmacy*  Lab Work: Your physician recommends that you return for lab work in: 3 months for fasting lipid and liver panel.  If you have labs (blood work) drawn today and your tests are completely normal, you will receive your results only by: MyChart Message (if you have MyChart) OR A paper copy in the mail If you have any lab test that is abnormal or we need to change your treatment, we will call you to review the results.  Testing/Procedures: None ordered today.  Follow-Up: At [facility], you and your health needs are our priority.  As part of our continuing mission to provide you with exceptional heart care, we have created designated Provider Care Teams.  These Care Teams include your primary Cardiologist (physician) and Advanced Practice Providers (APPs -  Physician Assistants and Nurse Practitioners) who all work together to provide you with the care you need, when you need it.  We recommend signing up for the patient portal called "MyChart".  Sign up information is provided on this After Visit Summary.  MyChart is used to connect with patients for Virtual Visits (Telemedicine).  Patients are able to view lab/test results, encounter notes, upcoming appointments, etc.  Non-urgent messages can be sent to your provider as well.   To learn more about what you can do with MyChart, go to [email or web address].    Your next appointment:   1 year(s)  Provider:   Charlton Haws, MD

## 2023-07-15 DIAGNOSIS — R7303 Prediabetes: Secondary | ICD-10-CM | POA: Diagnosis not present

## 2023-07-15 DIAGNOSIS — Z6841 Body Mass Index (BMI) 40.0 and over, adult: Secondary | ICD-10-CM | POA: Diagnosis not present

## 2023-07-15 DIAGNOSIS — M545 Low back pain, unspecified: Secondary | ICD-10-CM | POA: Diagnosis not present

## 2023-07-17 DIAGNOSIS — M4125 Other idiopathic scoliosis, thoracolumbar region: Secondary | ICD-10-CM | POA: Diagnosis not present

## 2023-07-17 DIAGNOSIS — M5431 Sciatica, right side: Secondary | ICD-10-CM | POA: Diagnosis not present

## 2023-07-17 DIAGNOSIS — M47814 Spondylosis without myelopathy or radiculopathy, thoracic region: Secondary | ICD-10-CM | POA: Diagnosis not present

## 2023-07-17 DIAGNOSIS — M47816 Spondylosis without myelopathy or radiculopathy, lumbar region: Secondary | ICD-10-CM | POA: Diagnosis not present

## 2023-07-17 DIAGNOSIS — M4804 Spinal stenosis, thoracic region: Secondary | ICD-10-CM | POA: Diagnosis not present

## 2023-07-19 ENCOUNTER — Other Ambulatory Visit: Payer: Self-pay | Admitting: Cardiovascular Disease

## 2023-07-30 DIAGNOSIS — M5451 Vertebrogenic low back pain: Secondary | ICD-10-CM | POA: Diagnosis not present

## 2023-08-01 DIAGNOSIS — M5451 Vertebrogenic low back pain: Secondary | ICD-10-CM | POA: Diagnosis not present

## 2023-08-06 DIAGNOSIS — M5451 Vertebrogenic low back pain: Secondary | ICD-10-CM | POA: Diagnosis not present

## 2023-08-09 DIAGNOSIS — M5451 Vertebrogenic low back pain: Secondary | ICD-10-CM | POA: Diagnosis not present

## 2023-08-13 ENCOUNTER — Ambulatory Visit (INDEPENDENT_AMBULATORY_CARE_PROVIDER_SITE_OTHER): Payer: PPO | Admitting: Psychology

## 2023-08-13 DIAGNOSIS — F3289 Other specified depressive episodes: Secondary | ICD-10-CM | POA: Diagnosis not present
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
## 2023-08-15 DIAGNOSIS — M5451 Vertebrogenic low back pain: Secondary | ICD-10-CM | POA: Diagnosis not present

## 2023-09-02 DIAGNOSIS — M5451 Vertebrogenic low back pain: Secondary | ICD-10-CM | POA: Diagnosis not present

## 2023-09-03 DIAGNOSIS — M65341 Trigger finger, right ring finger: Secondary | ICD-10-CM | POA: Diagnosis not present

## 2023-09-03 DIAGNOSIS — M65351 Trigger finger, right little finger: Secondary | ICD-10-CM | POA: Diagnosis not present

## 2023-09-03 DIAGNOSIS — M4125 Other idiopathic scoliosis, thoracolumbar region: Secondary | ICD-10-CM | POA: Diagnosis not present

## 2023-09-04 ENCOUNTER — Ambulatory Visit: Payer: PPO | Admitting: Psychology

## 2023-09-04 DIAGNOSIS — F3289 Other specified depressive episodes: Secondary | ICD-10-CM | POA: Diagnosis not present
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
## 2023-09-26 ENCOUNTER — Telehealth: Payer: Self-pay | Admitting: Psychology

## 2023-09-26 ENCOUNTER — Ambulatory Visit: Payer: PPO | Admitting: Psychology

## 2023-09-26 NOTE — Telephone Encounter (Signed)
 Counselor spoke to pt, when she wasn't logged in for appointment scheduled today at 1:30pm.  She informed she cancelled her appointment about an hour ago reporting last minute work conflict.  Pt reports she is doing really well and glad she could update counselor on that.  She reports she will followup as needed.

## 2023-10-01 ENCOUNTER — Other Ambulatory Visit: Payer: PPO

## 2023-10-01 DIAGNOSIS — Z951 Presence of aortocoronary bypass graft: Secondary | ICD-10-CM

## 2023-10-01 DIAGNOSIS — E785 Hyperlipidemia, unspecified: Secondary | ICD-10-CM

## 2023-12-10 ENCOUNTER — Ambulatory Visit: Admitting: Psychology

## 2023-12-10 DIAGNOSIS — F4321 Adjustment disorder with depressed mood: Secondary | ICD-10-CM
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
## 2023-12-19 ENCOUNTER — Ambulatory Visit: Admitting: Psychology

## 2023-12-19 DIAGNOSIS — F4321 Adjustment disorder with depressed mood: Secondary | ICD-10-CM | POA: Diagnosis not present

## 2023-12-19 NOTE — Progress Notes (Signed)
 [facility] Counselor/Therapist Progress Note  Patient ID: Cathy Love, MRN: [id number],    Date: 12/19/2023  Time Spent: 60 mins   start time: 1000   end time: 1100  Treatment Type: Family Therapy  Reported Symptoms: Pts present in person in the office for the session, granting consent for the session.  Mental Status Exam: Appearance:  Casual     Behavior: Appropriate  Motor: Normal  Speech/Language:  Clear and Coherent  Affect: Appropriate  Mood: normal  Thought process: normal  Thought content:   WNL  Sensory/Perceptual disturbances:   WNL  Orientation: oriented to person, place, and time/date  Attention: Good  Concentration: Good  Memory: WNL  Fund of knowledge:  Good  Insight:   Good  Judgment:  Good  Impulse Control: Good   Risk Assessment: Danger to Self:  No Self-injurious Behavior: No Danger to Others: No Duty to Warn:no Physical Aggression / Violence:No  Access to Firearms a concern: No  Gang Involvement:No   Subjective: Pts share they have been doing OK since our first session.  Cathy Love shares that she was pleased with our first session.  Cathy Love shares that he was not happy with the characterization of him as being angry.  Cathy Love mentions that he is very happy that Cathy Love's daughters have accepted him into their lives and his daughter likes Cathy Love as well.  Cathy Love is in his third marriage; his daughter, Cathy Love, was born from his first marriage and he had primary custody.  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  Cathy Love shares that he is still having some residual effects from his stroke; he feels he stutters more after the stroke.  He shares that he feels very fortunate to not be more negatively effected by it than he has been.  Encouraged pts to continue with their self care activities and we will meet in 6 weeks for a follow up session, due to my vacation.  Interventions: Cognitive Behavioral  Therapy  Diagnosis:Adjustment disorder with depressed mood  Plan: Treatment Plan Strengths/Abilities:  Intelligent, Intuitive, Willing to participate in therapy Treatment Preferences:  Outpatient Individual Therapy Statement of Needs:  Patient is to use CBT, mindfulness and coping skills to help manage and/or decrease symptoms associated with their diagnosis. Symptoms:  Depressed/Irritable mood, worry, social withdrawal Problems Addressed:  Depressive thoughts, Sadness, Sleep issues, etc. Long Term Goals:  Pt to reduce overall level, frequency, and intensity of the feelings of depression/anxiety as evidenced by decreased irritability, negative self talk, and helpless feelings from 6 to 7 days/week to 0 to 1 days/week, per client report, for at least 3 consecutive months.  Progress: 30% Short Term Goals:  Pt to verbally express understanding of the relationship between feelings of depression/anxiety and their impact on thinking patterns and behaviors.  Pt to verbalize an understanding of the role that distorted thinking plays in creating fears, excessive worry, and ruminations.  Progress: 30% Target Date:  12/18/2024 Frequency:  Bi-weekly Modality:  Cognitive Behavioral Therapy Interventions by Therapist:  Therapist will use CBT, Mindfulness exercises, Coping skills and Referrals, as needed by client. Client has verbally approved this treatment plan.  Karie Kirks, [facility]

## 2024-01-30 ENCOUNTER — Ambulatory Visit: Admitting: Psychology

## 2024-02-04 ENCOUNTER — Ambulatory Visit: Admitting: Psychology

## 2024-02-04 DIAGNOSIS — F4321 Adjustment disorder with depressed mood: Secondary | ICD-10-CM | POA: Diagnosis not present
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
## 2024-02-18 ENCOUNTER — Ambulatory Visit: Admitting: Psychology

## 2024-02-18 DIAGNOSIS — F4321 Adjustment disorder with depressed mood: Secondary | ICD-10-CM | POA: Diagnosis not present
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
## 2024-03-03 ENCOUNTER — Ambulatory Visit: Admitting: Psychology

## 2024-03-03 DIAGNOSIS — F4321 Adjustment disorder with depressed mood: Secondary | ICD-10-CM
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
## 2024-03-31 ENCOUNTER — Ambulatory Visit: Admitting: Psychology

## 2024-03-31 DIAGNOSIS — F4321 Adjustment disorder with depressed mood: Secondary | ICD-10-CM | POA: Diagnosis not present
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
## 2024-04-21 DIAGNOSIS — E785 Hyperlipidemia, unspecified: Secondary | ICD-10-CM | POA: Diagnosis not present

## 2024-04-21 DIAGNOSIS — E66813 Obesity, class 3: Secondary | ICD-10-CM | POA: Diagnosis not present

## 2024-04-21 DIAGNOSIS — I2510 Atherosclerotic heart disease of native coronary artery without angina pectoris: Secondary | ICD-10-CM | POA: Diagnosis not present

## 2024-04-21 DIAGNOSIS — R7303 Prediabetes: Secondary | ICD-10-CM | POA: Diagnosis not present

## 2024-04-21 DIAGNOSIS — Z6841 Body Mass Index (BMI) 40.0 and over, adult: Secondary | ICD-10-CM | POA: Diagnosis not present

## 2024-04-21 DIAGNOSIS — Z13820 Encounter for screening for osteoporosis: Secondary | ICD-10-CM | POA: Diagnosis not present

## 2024-04-21 DIAGNOSIS — Z0000 Encounter for general adult medical examination without abnormal findings: Secondary | ICD-10-CM | POA: Diagnosis not present

## 2024-04-29 ENCOUNTER — Other Ambulatory Visit: Payer: Self-pay | Admitting: Cardiovascular Disease

## 2024-07-30 DIAGNOSIS — E785 Hyperlipidemia, unspecified: Secondary | ICD-10-CM | POA: Diagnosis not present

## 2024-07-30 DIAGNOSIS — E66812 Obesity, class 2: Secondary | ICD-10-CM | POA: Diagnosis not present

## 2024-07-30 DIAGNOSIS — Z7689 Persons encountering health services in other specified circumstances: Secondary | ICD-10-CM | POA: Diagnosis not present

## 2024-07-30 DIAGNOSIS — Z6838 Body mass index (BMI) 38.0-38.9, adult: Secondary | ICD-10-CM | POA: Diagnosis not present

## 2024-07-30 DIAGNOSIS — I2510 Atherosclerotic heart disease of native coronary artery without angina pectoris: Secondary | ICD-10-CM | POA: Diagnosis not present

## 2024-07-30 DIAGNOSIS — R7303 Prediabetes: Secondary | ICD-10-CM | POA: Diagnosis not present

## 2024-08-06 NOTE — Progress Notes (Signed)
 Cardiology Office Note    Date:  08/14/2024   ID:  Cathy Love, DOB 04/09/1946, MRN [id number]   PCP:  Verena Mems, MD   [facility]  Cardiologist:  Maude Emmer, MD    History of Present Illness:  Cathy Love is a 78 y.o. 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 She underwent CABG x 3 LIMA-LAD, SVG-Cfx, SVG-PDA 02/21/22. She had normal heart function on echo. LDL 144.   Patient comes in for f/u. Doing well. No significant pain, dyspnea. ON high dose lipitor  needs update labs with primary  Belongs to a travel club Doing well stopped asa due to stomach upset and taking plavix   Needs MRI of back for chronic left sided pain Discussed need for better BS control  Has 3 daughters she is close to Two here and one at [facility] They like to vacation at [employer]   On [place] has lost 25 lbs No cardiac symptoms    Past Medical History:  Diagnosis Date   Carotid artery occlusion    Coronary artery disease    Hyperlipidemia     Past Surgical History:  Procedure Laterality Date   CARDIAC CATHETERIZATION     CORONARY ARTERY BYPASS GRAFT N/A 02/21/2022   Procedure: CORONARY ARTERY BYPASS GRAFTING (CABG) X BYPASSES USING OPEN LEFT INTERNAL MAMMARY ARTERY AND ENDOSCOPIC LEFT GREATER SAPHENOUS VEIN HARVEST.;  Surgeon: Obadiah Maude, MD;  Location: [facility] OR;  Service: Open Heart Surgery;  Laterality: N/A;   LEFT HEART CATH AND CORONARY ANGIOGRAPHY N/A 02/20/2022   Procedure: LEFT HEART CATH AND CORONARY ANGIOGRAPHY;  Surgeon: Claudene Victory ORN, MD;  Location: [facility] INVASIVE CV LAB;  Service: Cardiovascular;  Laterality: N/A;   TEE WITHOUT CARDIOVERSION N/A 02/21/2022   Procedure: TRANSESOPHAGEAL ECHOCARDIOGRAM (TEE);  Surgeon: Obadiah Maude, MD;  Location: [facility] OR;  Service: Open Heart Surgery;  Laterality: N/A;    Current Medications: Current Meds  Medication Sig   Acetaminophen  (TYLENOL ) 325 MG CAPS Take 1  capsule by mouth daily. (Patient taking differently: Take 1 capsule by mouth daily. Takes as needed)   atorvastatin  (LIPITOR ) 80 MG tablet Take 1 tablet (80 mg total) by mouth daily.   Ca Phosphate-Cholecalciferol (CALCIUM  WITH D3 PO) Take 1 tablet by mouth every other day.   clopidogrel  (PLAVIX ) 75 MG tablet TAKE 1 TABLET(75 MG) BY MOUTH DAILY     Allergies:   Patient has no known allergies.   Social History   Socioeconomic History   Marital status: Married    Spouse name: Not on file   Number of children: Not on file   Years of education: 14   Highest education level: Not on file  Occupational History   Not on file  Tobacco Use   Smoking status: Never   Smokeless tobacco: Never  Substance and Sexual Activity   Alcohol use: Not Currently   Drug use: Never   Sexual activity: Yes  Other Topics Concern   Not on file  Social History Narrative   Not on file   Social Drivers of Health   Financial Resource Strain: Not on file  Food Insecurity: Not on file  Transportation Needs: Not on file  Physical Activity: Not on file  Stress: Not on file  Social Connections: Not on file     Family History:  The patient's  family history is not on file.   ROS:   Please see the history of present illness.  ROS All other systems reviewed and are negative.   PHYSICAL EXAM:   VS:  BP 124/66   Pulse 66   Ht 4' 9 (1.448 m)   Wt 173 lb (78.5 kg)   SpO2 95%   BMI 37.44 kg/m   Physical Exam  Affect appropriate Healthy:  appears stated age HEENT: normal Neck supple with no adenopathy JVP normal no bruits no thyromegaly Lungs clear with no wheezing and good diaphragmatic motion Heart:  S1/S2 no murmur, no rub, gallop or click PMI normal post sternotomy Abdomen: benighn, BS positve, no tenderness, no AAA no bruit.  No HSM or HJR Distal pulses intact with no bruits No edema Neuro non-focal Left leg greater SVG harvest    Wt Readings from Last 3 Encounters:  08/14/24 173 lb  (78.5 kg)  06/27/23 188 lb (85.3 kg)  05/28/23 189 lb 12.8 oz (86.1 kg)      Studies/Labs Reviewed:   EKG:  08/14/2024 NSR rate 66 normal    Recent Labs: No results found for requested labs within last 365 days.   Lipid Panel    Component Value Date/Time   CHOL 150 05/21/2022 0835   TRIG 110 05/21/2022 0835   HDL 50 05/21/2022 0835   CHOLHDL 3.0 05/21/2022 0835   CHOLHDL 5.2 02/19/2022 0150   VLDL 34 02/19/2022 0150   LDLCALC 80 05/21/2022 0835    Additional studies/ records that were reviewed today include:  CARDIAC CATHETERIZATION   Result Date: 02/20/2022 CONCLUSIONS: Right dominant anatomy with proximal to mid 90% calcified stenosis in RCA. Short left main Proximal LAD 85 to 90% stenosis followed by post stenotic dilatation/aneurysm.  LAD contains diffuse disease in the midsegment up to 80% after the origin of the second diagonal. Circumflex is large gives origin to 4 obtuse marginal branches.  2 branches are very small.  The first branch contains segmental 95% stenosis ostial to proximal.  The distal circumflex contains 80% stenosis before the origin of the large fourth obtuse marginal. Mid anterior wall focal hypokinesis.  Overall EF 55 to 60%.  EDP is normal. RECOMMENDATIONS: Resume IV heparin  Aggressive risk factor modification T CTS consultation for CABG.  Will need to remain in hospital as she presented with non-ST elevation MI awakening with spontaneous jaw and chest discomfort which lasted approximately 2 hours.    ECHOCARDIOGRAM COMPLETE   Result Date: 02/18/2022    ECHOCARDIOGRAM REPORT   Patient Name:   Cathy Love Date of Exam: 02/18/2022 Medical Rec #:  [id number]        Height:       56.5 in Accession #:    [id number]       Weight:       183.6 lb Date of Birth:  01-Dec-1945       BSA:          1.723 m Patient Age:    75 years         BP:           134/85 mmHg Patient Gender: F                HR:           60 bpm. Exam Location:  Inpatient Procedure: 2D Echo,  Cardiac Doppler, Color Doppler and Strain Analysis Indications:    Chest pain                 MI  History:        Patient has no prior history  of Echocardiogram examinations.  Sonographer:    EMMIE DEW RDCS Referring Phys: [id number] LAURA R INGOLD  Sonographer Comments: Suboptimal parasternal window, suboptimal apical window and suboptimal subcostal window. IMPRESSIONS  1. Left ventricular ejection fraction, by estimation, is 60 to 65%. The left ventricle has normal function. The left ventricle has no regional wall motion abnormalities. Left ventricular diastolic parameters were normal.  2. Right ventricular systolic function is normal. The right ventricular size is normal.  3. The mitral valve is normal in structure. No evidence of mitral valve regurgitation. No evidence of mitral stenosis.  4. The aortic valve is tricuspid. There is mild calcification of the aortic valve. There is mild thickening of the aortic valve. Aortic valve regurgitation is not visualized. Aortic valve sclerosis is present, with no evidence of aortic valve stenosis.  5. The inferior vena cava is normal in size with greater than 50% respiratory variability, suggesting right atrial pressure of 3 mmHg. FINDINGS  Left Ventricle: Left ventricular ejection fraction, by estimation, is 60 to 65%. The left ventricle has normal function. The left ventricle has no regional wall motion abnormalities. The left ventricular internal cavity size was normal in size. There is  no left ventricular hypertrophy. Left ventricular diastolic parameters were normal. Right Ventricle: The right ventricular size is normal. No increase in right ventricular wall thickness. Right ventricular systolic function is normal. Left Atrium: Left atrial size was normal in size. Right Atrium: Right atrial size was normal in size. Pericardium: There is no evidence of pericardial effusion. Mitral Valve: The mitral valve is normal in structure. No evidence of mitral valve regurgitation.  No evidence of mitral valve stenosis. Tricuspid Valve: The tricuspid valve is normal in structure. Tricuspid valve regurgitation is not demonstrated. No evidence of tricuspid stenosis. Aortic Valve: The aortic valve is tricuspid. There is mild calcification of the aortic valve. There is mild thickening of the aortic valve. Aortic valve regurgitation is not visualized. Aortic valve sclerosis is present, with no evidence of aortic valve stenosis. Aortic valve mean gradient measures 4.0 mmHg. Aortic valve peak gradient measures 7.8 mmHg. Aortic valve area, by VTI measures 1.73 cm. Pulmonic Valve: The pulmonic valve was normal in structure. Pulmonic valve regurgitation is not visualized. No evidence of pulmonic stenosis. Aorta: The aortic root is normal in size and structure. Venous: The inferior vena cava is normal in size with greater than 50% respiratory variability, suggesting right atrial pressure of 3 mmHg. IAS/Shunts: No atrial level shunt detected by color flow Doppler.  LEFT VENTRICLE PLAX 2D LVIDd:         4.00 cm     Diastology LVIDs:         1.90 cm     LV e' medial:    7.43 cm/s LV PW:         1.30 cm     LV E/e' medial:  5.7 LV IVS:        1.20 cm     LV e' lateral:   6.83 cm/s LVOT diam:     2.10 cm     LV E/e' lateral: 6.3 LV SV:         52 LV SV Index:   30 LVOT Area:     3.46 cm  LV Volumes (MOD) LV vol d, MOD A2C: 41.3 ml LV vol d, MOD A4C: 36.6 ml LV vol s, MOD A2C: 22.7 ml LV vol s, MOD A4C: 19.9 ml LV SV MOD A2C:     18.6 ml LV SV  MOD A4C:     36.6 ml LV SV MOD BP:      18.7 ml RIGHT VENTRICLE RV S prime:     9.60 cm/s TAPSE (M-mode): 1.6 cm LEFT ATRIUM             Index LA diam:        3.40 cm 1.97 cm/m LA Vol (A2C):   33.0 ml 19.15 ml/m LA Vol (A4C):   44.9 ml 26.05 ml/m LA Biplane Vol: 39.7 ml 23.04 ml/m  AORTIC VALVE                    PULMONIC VALVE AV Area (Vmax):    1.81 cm     PV Vmax:       0.77 m/s AV Area (Vmean):   1.71 cm     PV Vmean:      55.000 cm/s AV Area (VTI):     1.73  cm     PV VTI:        0.191 m AV Vmax:           140.00 cm/s  PV Peak grad:  2.4 mmHg AV Vmean:          94.300 cm/s  PV Mean grad:  1.0 mmHg AV VTI:            0.301 m AV Peak Grad:      7.8 mmHg AV Mean Grad:      4.0 mmHg LVOT Vmax:         73.20 cm/s LVOT Vmean:        46.600 cm/s LVOT VTI:          0.150 m LVOT/AV VTI ratio: 0.50  AORTA Ao Root diam: 2.40 cm Ao Asc diam:  2.70 cm MITRAL VALVE MV Area (PHT): 2.99 cm    SHUNTS MV Decel Time: 254 msec    Systemic VTI:  0.15 m MV E velocity: 42.70 cm/s  Systemic Diam: 2.10 cm MV A velocity: 70.30 cm/s MV E/A ratio:  0.61 Maude Emmer MD Electronically signed by Maude Emmer MD Signature Date/Time: 02/18/2022/3:49:03 PM    Final      PLAN:  In order of problems listed above:  CAD S/P CABG  02/03/22 by PVT x 3 LIMA-LAD, SVG-Cfx, SVG-PDA 02/21/22, normal LVEF on echo. Feels well. Continue Lipitor , plavix  and metoprolol . Stomach upset with ASA   HLD LDL goal < 70 update labs on high dose lipitor   Obesity-exercise and weight loss  Carotid:  PMH reports carotid occlusion:  This is false Her husband has a carotid stent she has no documented dx  Lipid / Liver     F/U in a year   Signed, Maude Emmer, MD  08/14/2024 9:24 AM    [facility] [address] [place], [place], [place]  [address] Phone: [phone]; Fax: [phone]

## 2024-08-14 ENCOUNTER — Ambulatory Visit: Attending: Cardiovascular Disease | Admitting: Cardiovascular Disease

## 2024-08-14 ENCOUNTER — Encounter: Payer: Self-pay | Admitting: Cardiovascular Disease

## 2024-08-14 VITALS — BP 124/66 | HR 66 | Ht <= 58 in | Wt 173.0 lb

## 2024-08-14 DIAGNOSIS — I2510 Atherosclerotic heart disease of native coronary artery without angina pectoris: Secondary | ICD-10-CM | POA: Diagnosis not present

## 2024-08-14 DIAGNOSIS — E782 Mixed hyperlipidemia: Secondary | ICD-10-CM | POA: Diagnosis not present

## 2024-08-14 DIAGNOSIS — Z951 Presence of aortocoronary bypass graft: Secondary | ICD-10-CM | POA: Diagnosis not present

## 2024-08-14 NOTE — Patient Instructions (Signed)
 Medication Instructions:   Your physician recommends that you continue on your current medications as directed. Please refer to the Current Medication list given to you today.  *If you need a refill on your cardiac medications before your next appointment, please call your pharmacy*    Lab Work: NONE ORDERED  TODAY     If you have labs (blood work) drawn today and your tests are completely normal, you will receive your results only by: MyChart Message (if you have MyChart) OR A paper copy in the mail If you have any lab test that is abnormal or we need to change your treatment, we will call you to review the results.    Testing/Procedures: NONE ORDERED  TODAY     Follow-Up: At [facility], you and your health needs are our priority.  As part of our continuing mission to provide you with exceptional heart care, our providers are all part of one team.  This team includes your primary Cardiologist (physician) and Advanced Practice Providers or APPs (Physician Assistants and Nurse Practitioners) who all work together to provide you with the care you need, when you need it.   Your next appointment:    1 year(s)   Provider:    Maude Emmer, MD     We recommend signing up for the patient portal called MyChart.  Sign up information is provided on this After Visit Summary.  MyChart is used to connect with patients for Virtual Visits (Telemedicine).  Patients are able to view lab/test results, encounter notes, upcoming appointments, etc.  Non-urgent messages can be sent to your provider as well.   To learn more about what you can do with MyChart, go to [email or web address].   Other Instructions

## 2024-09-28 ENCOUNTER — Other Ambulatory Visit (HOSPITAL_COMMUNITY): Payer: Self-pay

## 2024-10-08 ENCOUNTER — Other Ambulatory Visit (HOSPITAL_COMMUNITY): Payer: Self-pay

## 2024-10-09 ENCOUNTER — Other Ambulatory Visit (HOSPITAL_COMMUNITY): Payer: Self-pay

## 2024-10-09 MED ORDER — WEGOVY 1.5 MG PO TABS
1.5000 mg | ORAL_TABLET | Freq: Every morning | ORAL | 0 refills | Status: AC
  Filled 2024-10-09 (×2): qty 30, 30d supply, fill #0

## 2024-10-11 ENCOUNTER — Other Ambulatory Visit: Payer: Self-pay

## 2024-10-12 ENCOUNTER — Encounter (HOSPITAL_COMMUNITY): Payer: Self-pay

## 2024-10-12 ENCOUNTER — Other Ambulatory Visit (HOSPITAL_COMMUNITY): Payer: Self-pay

## 2024-10-15 ENCOUNTER — Encounter (HOSPITAL_COMMUNITY): Payer: Self-pay

## 2024-10-15 ENCOUNTER — Other Ambulatory Visit (HOSPITAL_COMMUNITY): Payer: Self-pay

## 2024-10-16 ENCOUNTER — Encounter (HOSPITAL_COMMUNITY): Payer: Self-pay

## 2024-10-16 ENCOUNTER — Other Ambulatory Visit: Payer: Self-pay | Admitting: Cardiovascular Disease

## 2024-10-16 ENCOUNTER — Other Ambulatory Visit (HOSPITAL_COMMUNITY): Payer: Self-pay

## 2024-10-16 MED ORDER — CLOPIDOGREL BISULFATE 75 MG PO TABS
75.0000 mg | ORAL_TABLET | Freq: Every day | ORAL | 3 refills | Status: AC
  Filled 2024-10-16: qty 90, 90d supply, fill #0

## 2024-10-19 ENCOUNTER — Other Ambulatory Visit (HOSPITAL_COMMUNITY): Payer: Self-pay
